# Patient Record
Sex: Female | Born: 1965
Health system: Southern US, Community
[De-identification: ages and names within clinical notes are randomized; demographics above are authoritative.]

## PROBLEM LIST (undated history)

## (undated) DIAGNOSIS — O1490 Unspecified pre-eclampsia, unspecified trimester: Secondary | ICD-10-CM

## (undated) DIAGNOSIS — G43909 Migraine, unspecified, not intractable, without status migrainosus: Secondary | ICD-10-CM

## (undated) DIAGNOSIS — R011 Cardiac murmur, unspecified: Secondary | ICD-10-CM

## (undated) DIAGNOSIS — J45909 Unspecified asthma, uncomplicated: Secondary | ICD-10-CM

## (undated) DIAGNOSIS — J189 Pneumonia, unspecified organism: Secondary | ICD-10-CM

## (undated) DIAGNOSIS — M722 Plantar fascial fibromatosis: Secondary | ICD-10-CM

## (undated) DIAGNOSIS — G510 Bell's palsy: Secondary | ICD-10-CM

## (undated) HISTORY — DX: Migraine, unspecified, not intractable, without status migrainosus: G43.909

## (undated) HISTORY — DX: Unspecified asthma, uncomplicated: J45.909

## (undated) HISTORY — DX: Unspecified pre-eclampsia, unspecified trimester: O14.90

## (undated) HISTORY — DX: Bell's palsy: G51.0

## (undated) HISTORY — DX: Cardiac murmur, unspecified: R01.1

## (undated) HISTORY — DX: Pneumonia, unspecified organism: J18.9

## (undated) HISTORY — DX: Plantar fascial fibromatosis: M72.2

---

## 1998-08-31 DIAGNOSIS — G43909 Migraine, unspecified, not intractable, without status migrainosus: Secondary | ICD-10-CM

## 1998-08-31 HISTORY — DX: Migraine, unspecified, not intractable, without status migrainosus: G43.909

## 1999-03-21 ENCOUNTER — Inpatient Hospital Stay (HOSPITAL_COMMUNITY): Admission: AD | Admit: 1999-03-21 | Discharge: 1999-03-24 | Payer: Self-pay | Admitting: Obstetrics and Gynecology

## 1999-05-01 ENCOUNTER — Other Ambulatory Visit: Admission: RE | Admit: 1999-05-01 | Discharge: 1999-05-01 | Payer: Self-pay | Admitting: Obstetrics and Gynecology

## 2000-05-07 ENCOUNTER — Other Ambulatory Visit: Admission: RE | Admit: 2000-05-07 | Discharge: 2000-05-07 | Payer: Self-pay | Admitting: Obstetrics and Gynecology

## 2001-08-31 HISTORY — PX: DILATION AND CURETTAGE OF UTERUS: SHX78

## 2001-11-25 ENCOUNTER — Ambulatory Visit (HOSPITAL_COMMUNITY): Admission: RE | Admit: 2001-11-25 | Discharge: 2001-11-25 | Payer: Self-pay | Admitting: Obstetrics and Gynecology

## 2002-09-27 ENCOUNTER — Other Ambulatory Visit: Admission: RE | Admit: 2002-09-27 | Discharge: 2002-09-27 | Payer: Self-pay | Admitting: Obstetrics and Gynecology

## 2003-02-01 ENCOUNTER — Ambulatory Visit (HOSPITAL_COMMUNITY): Admission: AD | Admit: 2003-02-01 | Discharge: 2003-02-01 | Payer: Self-pay | Admitting: Obstetrics and Gynecology

## 2003-04-09 ENCOUNTER — Inpatient Hospital Stay (HOSPITAL_COMMUNITY): Admission: AD | Admit: 2003-04-09 | Discharge: 2003-04-09 | Payer: Self-pay | Admitting: Obstetrics and Gynecology

## 2003-04-24 ENCOUNTER — Inpatient Hospital Stay (HOSPITAL_COMMUNITY): Admission: AD | Admit: 2003-04-24 | Discharge: 2003-04-27 | Payer: Self-pay | Admitting: Obstetrics & Gynecology

## 2003-04-30 ENCOUNTER — Encounter: Admission: RE | Admit: 2003-04-30 | Discharge: 2003-05-30 | Payer: Self-pay | Admitting: Obstetrics and Gynecology

## 2003-06-12 ENCOUNTER — Other Ambulatory Visit: Admission: RE | Admit: 2003-06-12 | Discharge: 2003-06-12 | Payer: Self-pay | Admitting: Obstetrics and Gynecology

## 2004-09-24 ENCOUNTER — Other Ambulatory Visit: Admission: RE | Admit: 2004-09-24 | Discharge: 2004-09-24 | Payer: Self-pay | Admitting: Obstetrics and Gynecology

## 2005-10-21 ENCOUNTER — Other Ambulatory Visit: Admission: RE | Admit: 2005-10-21 | Discharge: 2005-10-21 | Payer: Self-pay | Admitting: Obstetrics and Gynecology

## 2011-09-01 DIAGNOSIS — J189 Pneumonia, unspecified organism: Secondary | ICD-10-CM

## 2011-09-01 HISTORY — DX: Pneumonia, unspecified organism: J18.9

## 2011-10-06 ENCOUNTER — Telehealth: Payer: Self-pay | Admitting: Neurology

## 2011-10-06 NOTE — Telephone Encounter (Signed)
Dr. Laurey Morale office would like to know if we can work in this pt for bell's palsy. She has had bell's palsy for 2 weeks now.

## 2011-10-07 NOTE — Telephone Encounter (Signed)
ok 

## 2011-10-07 NOTE — Telephone Encounter (Signed)
Appt scheduled for 10/15/11 at 11:30

## 2011-10-15 ENCOUNTER — Encounter: Payer: Self-pay | Admitting: Neurology

## 2011-10-15 ENCOUNTER — Other Ambulatory Visit (INDEPENDENT_AMBULATORY_CARE_PROVIDER_SITE_OTHER): Payer: BLUE CROSS/BLUE SHIELD

## 2011-10-15 ENCOUNTER — Ambulatory Visit (INDEPENDENT_AMBULATORY_CARE_PROVIDER_SITE_OTHER): Payer: BLUE CROSS/BLUE SHIELD | Admitting: Neurology

## 2011-10-15 DIAGNOSIS — G510 Bell's palsy: Secondary | ICD-10-CM | POA: Insufficient documentation

## 2011-10-15 LAB — CBC WITH DIFFERENTIAL/PLATELET
Basophils Relative: 0.7 % (ref 0.0–3.0)
Eosinophils Absolute: 0 10*3/uL (ref 0.0–0.7)
Eosinophils Relative: 0 % (ref 0.0–5.0)
Hemoglobin: 13.2 g/dL (ref 12.0–15.0)
Lymphocytes Relative: 35.5 % (ref 12.0–46.0)
MCHC: 33.7 g/dL (ref 30.0–36.0)
MCV: 93.5 fl (ref 78.0–100.0)
Monocytes Absolute: 0.4 10*3/uL (ref 0.1–1.0)
Neutro Abs: 2.5 10*3/uL (ref 1.4–7.7)
Neutrophils Relative %: 55.6 % (ref 43.0–77.0)
RBC: 4.21 Mil/uL (ref 3.87–5.11)
WBC: 4.4 10*3/uL — ABNORMAL LOW (ref 4.5–10.5)

## 2011-10-15 LAB — SEDIMENTATION RATE: Sed Rate: 10 mm/hr (ref 0–22)

## 2011-10-15 NOTE — Patient Instructions (Signed)
Go to the basement to have your labs drawn today.  Your MRI is scheduled at the [facility] right beside [facility]. The address is [address] [place]. Your appointment is Saturday, February 16th at 10:00 am. Please arrive 15 minutes prior to your scheduled appointment.     [phone].

## 2011-10-15 NOTE — Progress Notes (Signed)
Dear Dr. Waynard Edwards,  Thank you for having me see Christina Rojas in consultation today at [facility] for her problem with an idiopathic right CN 7 palsy.  As you may recall, she is a 46 y.o. year old female with a history of migraines who presented about 5 weeks ago with quickly progressive right facial droop, right sided hyperacusis and numbness on the face.  You diagnosed her with a Bell's palsy and gave her a steroid taper and anti-viral.  She has had quick recovery of her function.  She does continue to complain of numbness on the right face.    She denies any other neighborhood signs.  No exposure to ticks.  Past Medical History  Diagnosis Date  . Migraines 2000    Past Surgical History  Procedure Date  . Dilation and curettage of uterus 2003    History   Social History  . Marital Status: Single    Spouse Name: N/A    Number of Children: N/A  . Years of Education: N/A   Social History Main Topics  . Smoking status: Never Smoker   . Smokeless tobacco: Never Used  . Alcohol Use: None     occasion/socially  . Drug Use: None  . Sexually Active: None   Other Topics Concern  . None   Social History Narrative  . None    No family history of Bell's palsy.  No current outpatient prescriptions on file prior to visit.    No Known Allergies    ROS:  13 systems were reviewed and are notable for no fevers or chills.  All other review of systems are unremarkable.   Examination:  Filed Vitals:   10/15/11 1119  BP: 136/70  Pulse: 80  Weight: 207 lb (93.895 kg)     In general, well appearing women.  Cardiovascular: The patient has a regular rate and rhythm and no carotid bruits.  Fundoscopy:  Disks are flat. Vessel caliber within normal limits.  Mental status:   The patient is oriented to person, place and time. Recent and remote memory are intact. Attention span and concentration are normal. Language including repetition, naming, following commands are  intact. Fund of knowledge of current and historical events, as well as vocabulary are normal.  Cranial Nerves: Pupils are equally round and reactive to light. Visual fields full to confrontation. Extraocular movements are intact without nystagmus. She has some sensory loss to light touch over V3. She can close her right eye.  There is mild asymmetry in the right facial fold with flattening on the right.  Her blink is slightly asymmetric. Hearing is louder on right. Tongue protrusion, uvula, palate midline.  Shoulder shrug intact  Motor:  The patient has normal bulk and tone, no pronator drift.  There are no adventitious movements.  5/5 muscle strength bilaterally.  Reflexes:   Biceps  Triceps Brachioradialis Knee Ankle  Right 2+  2+  2+   2+ 2+  Left  2+  2+  2+   2+ 2+  Toes down  Coordination:  Normal finger to nose.  No dysdiadokinesia.  Sensation is intact to temperature and vibration.  Gait and Station are normal.  Tandem gait is intact.  Romberg is negative   Impression: Likely idiopathic Bell's palsy.  However, the sensory loss makes me question the diagnosis.  While numbness is sometimes described it anatomically implicates the 5th cranial nerve.  I am going to get lyme titres as well as an MRI brain with and  without contrast with thin cuts through the brainstem.  However, I expect these will be normal.  If these tests are normal and she continues to improve I don't need to see her back.    Thank you for having [place] see Christina Rojas in consultation.  Feel free to contact me with any questions.  Lupita Raider Modesto Charon, MD [facility], [place] [address] [place], [place] [address] Phone: [phone] Fax: [phone].

## 2011-10-16 LAB — B. BURGDORFI ANTIBODIES: B burgdorferi Ab IgG+IgM: 0.24 {ISR}

## 2011-10-17 ENCOUNTER — Ambulatory Visit (HOSPITAL_COMMUNITY)
Admission: RE | Admit: 2011-10-17 | Discharge: 2011-10-17 | Disposition: A | Discharge status: Home / Self Care | Payer: BC Managed Care – PPO | Source: Ambulatory Visit | Attending: Neurology | Admitting: Neurology

## 2011-10-17 DIAGNOSIS — G43909 Migraine, unspecified, not intractable, without status migrainosus: Secondary | ICD-10-CM | POA: Insufficient documentation

## 2011-10-17 DIAGNOSIS — G510 Bell's palsy: Secondary | ICD-10-CM | POA: Insufficient documentation

## 2011-10-17 DIAGNOSIS — H93299 Other abnormal auditory perceptions, unspecified ear: Secondary | ICD-10-CM | POA: Insufficient documentation

## 2011-10-17 DIAGNOSIS — R209 Unspecified disturbances of skin sensation: Secondary | ICD-10-CM | POA: Insufficient documentation

## 2011-10-17 DIAGNOSIS — Q048 Other specified congenital malformations of brain: Secondary | ICD-10-CM | POA: Insufficient documentation

## 2011-10-17 MED ORDER — GADOBENATE DIMEGLUMINE 529 MG/ML IV SOLN
20.0000 mL | Freq: Once | INTRAVENOUS | Status: AC | PRN
  Administered 2011-10-17: 20 mL via INTRAVENOUS

## 2011-10-19 ENCOUNTER — Telehealth: Payer: Self-pay | Admitting: Neurology

## 2011-10-19 NOTE — Telephone Encounter (Signed)
Called and spoke with the patient. Information given as per Dr. Wong re: MRI. The patient will call us if needed. No additional concerns/questions at this time. 

## 2011-10-19 NOTE — Telephone Encounter (Signed)
Message copied by Benay Spice on Mon Oct 19, 2011  2:10 PM ------      Message from: Milas Gain      Created: Mon Oct 19, 2011  8:55 AM       Let Ms. Sobiech know her MRI brain looked good, except for some inflammation of the right facial nerve which is what we expect to see in Bell's palsy.

## 2011-10-19 NOTE — Telephone Encounter (Signed)
Called and spoke with the patient. Information given as per Dr. Modesto Charon re: MRI. The patient will call us if needed. No additional concerns/questions at this time.

## 2011-10-19 NOTE — Telephone Encounter (Signed)
Message copied by Benay Spice on Mon Oct 19, 2011  2:12 PM ------      Message from: Denton Meek H      Created: Mon Oct 19, 2011  8:55 AM       Please let Ms. Raudenbush know her MRI was ok.

## 2011-10-22 NOTE — Progress Notes (Signed)
Spoke with the patient. Informed lab work normal.

## 2012-02-29 HISTORY — PX: OTHER SURGICAL HISTORY: SHX169

## 2012-04-05 ENCOUNTER — Other Ambulatory Visit (HOSPITAL_COMMUNITY): Payer: Self-pay | Admitting: Internal Medicine

## 2012-04-05 DIAGNOSIS — I059 Rheumatic mitral valve disease, unspecified: Secondary | ICD-10-CM

## 2012-04-06 ENCOUNTER — Ambulatory Visit (HOSPITAL_COMMUNITY): Payer: BC Managed Care – PPO | Attending: Cardiovascular Disease

## 2012-04-06 DIAGNOSIS — I059 Rheumatic mitral valve disease, unspecified: Secondary | ICD-10-CM

## 2012-04-06 DIAGNOSIS — G510 Bell's palsy: Secondary | ICD-10-CM | POA: Insufficient documentation

## 2012-04-06 DIAGNOSIS — I10 Essential (primary) hypertension: Secondary | ICD-10-CM | POA: Insufficient documentation

## 2012-04-06 DIAGNOSIS — J45909 Unspecified asthma, uncomplicated: Secondary | ICD-10-CM | POA: Insufficient documentation

## 2012-04-06 NOTE — Progress Notes (Signed)
Echocardiogram performed.  

## 2012-04-07 ENCOUNTER — Encounter (HOSPITAL_COMMUNITY): Payer: Self-pay | Admitting: Internal Medicine

## 2012-08-31 DIAGNOSIS — G510 Bell's palsy: Secondary | ICD-10-CM

## 2012-08-31 HISTORY — PX: ROTATOR CUFF REPAIR: SHX139

## 2012-08-31 HISTORY — DX: Bell's palsy: G51.0

## 2013-12-25 ENCOUNTER — Encounter (INDEPENDENT_AMBULATORY_CARE_PROVIDER_SITE_OTHER): Payer: Self-pay

## 2013-12-25 ENCOUNTER — Ambulatory Visit (INDEPENDENT_AMBULATORY_CARE_PROVIDER_SITE_OTHER): Payer: BC Managed Care – PPO | Admitting: Neurology

## 2013-12-25 ENCOUNTER — Encounter: Payer: Self-pay | Admitting: Neurology

## 2013-12-25 VITALS — BP 128/84 | HR 70 | Temp 97.7°F | Ht 68.0 in | Wt 215.0 lb

## 2013-12-25 DIAGNOSIS — R51 Headache: Secondary | ICD-10-CM

## 2013-12-25 DIAGNOSIS — E669 Obesity, unspecified: Secondary | ICD-10-CM

## 2013-12-25 DIAGNOSIS — G4710 Hypersomnia, unspecified: Secondary | ICD-10-CM

## 2013-12-25 DIAGNOSIS — R400 Somnolence: Secondary | ICD-10-CM

## 2013-12-25 DIAGNOSIS — G43909 Migraine, unspecified, not intractable, without status migrainosus: Secondary | ICD-10-CM

## 2013-12-25 DIAGNOSIS — G4733 Obstructive sleep apnea (adult) (pediatric): Secondary | ICD-10-CM

## 2013-12-25 DIAGNOSIS — R519 Headache, unspecified: Secondary | ICD-10-CM

## 2013-12-25 NOTE — Progress Notes (Signed)
Subjective:    Patient ID: Christina Rojas is a 48 y.o. female.  HPI    Huston Foley, MD, PhD [facility] [address], [address] [address] [place], [place] [address]  Dear Dr. Waynard Edwards,   I saw your patient, Christina Rojas, upon your kind request in my neurologic clinic today for initial consultation of her sleep disorder, in particular, concern for obstructive sleep apnea. The patient is unaccompanied today. As you know, Christina Rojas is a 48 year old right-handed woman with an underlying medical history of right Bell's palsy in 1/14, migraine headaches, obesity, history of pneumonia in 2013, plantar fasciitis, heart murmur with mitral prolapse and mild to moderate pulmonary insufficiency per echocardiogram, mild asthma, who is reported to snore and she complains of excessive daytime somnolence and morning HAs. She has been gaining weight. She was on Zonegran for migraine prevention per [facility], but stopped it in 2/14.  Her typical bedtime is reported to be around 10 to 11 PM and usual wake time is around 6 AM. Sleep onset typically occurs within a few minutes. She reports feeling marginally rested upon awakening. She wakes up on an average 3 times in the middle of the night and has to go to the bathroom 1 time on a typical night. She reports occasional morning headaches.  She reports excessive daytime somnolence (EDS) and Her Epworth Sleepiness Score (ESS) is 11/24 today. She has not fallen asleep while driving. The patient has not been taking a scheduled nap, except for sometimes on the weekends. She  reports feeling refreshed after a nap.  She has been known to snore for the past few years. Snoring is reportedly moderate to loud, but unclear if associated with choking sounds and witnessed apneas. The patient admits to a rare sense of choking or strangling feeling. There is no report of nighttime reflux, with no nighttime cough experienced. The patient has not  noted any RLS symptoms and is not known to kick while asleep or before falling asleep. There is no family history of RLS or OSA.  She is a restless sleeper and in the morning, the bed is quite disheveled.   She denies cataplexy, sleep paralysis, hypnagogic or hypnopompic hallucinations, or sleep attacks. She does not report any vivid dreams, nightmares, dream enactments, or parasomnias, such as sleep talking or sleep walking. The patient has not had a sleep study or a home sleep test.  She consumes  4-5 caffeinated beverages per day, usually in the form of Diet Coke but often she uses the caffeine-free kind.   Her bedroom is usually dark and cool. There is a TV in the bedroom and usually it is not on at night; she watches TV prior to falling asleep and turns it off.   Her Past Medical History Is Significant For: Past Medical History  Diagnosis Date  . Migraines 2000    Her Past Surgical History Is Significant For: Past Surgical History  Procedure Laterality Date  . Dilation and curettage of uterus  2003  . Rotator cuff repair Right 2014    Her Family History Is Significant For: Family History  Problem Relation Age of Onset  . Dementia Mother     Alzheimers    Her Social History Is Significant For: History   Social History  . Marital Status: Single    Spouse Name: N/A    Number of Children: N/A  . Years of Education: N/A   Social History Main Topics  . Smoking status:  Never Smoker   . Smokeless tobacco: Never Used  . Alcohol Use: None     Comment: occasion/socially  . Drug Use: None  . Sexual Activity: None   Other Topics Concern  . None   Social History Narrative  . None    Her Allergies Are:  No Known Allergies:   Her Current Medications Are:  Outpatient Encounter Prescriptions as of 12/25/2013  Medication Sig  . SPRINTEC 28 0.25-35 MG-MCG tablet Take 1 tablet by mouth daily.  Marland Kitchen. zonisamide (ZONEGRAN) 100 MG capsule Take 400 mg by mouth at bedtime.   :  Review of Systems:  Out of a complete 14 point review of systems, all are reviewed and negative with the exception of these symptoms as listed below:  Review of Systems  Constitutional: Positive for fatigue.  Eyes: Negative.   Respiratory: Positive for apnea (snoring).   Cardiovascular: Negative.   Gastrointestinal: Negative.   Endocrine: Negative.   Genitourinary: Negative.   Musculoskeletal: Negative.   Skin: Negative.   Allergic/Immunologic: Negative.   Neurological: Positive for headaches.  Hematological: Negative.   Psychiatric/Behavioral: Positive for sleep disturbance (e.d.s., snoring, frequent waking).    Objective:  Neurologic Exam  Physical Exam Physical Examination:   Filed Vitals:   12/25/13 1016  BP: 128/84  Pulse: 70  Temp: 97.7 F (36.5 C)    General Examination: The patient is a very pleasant 48 y.o. female in no acute distress. She appears well-developed and well-nourished and well groomed.   HEENT: Normocephalic, atraumatic, pupils are equal, round and reactive to light and accommodation. Funduscopic exam is normal with sharp disc margins noted. Extraocular tracking is good without limitation to gaze excursion or nystagmus noted. Normal smooth pursuit is noted. Hearing is grossly intact. Tympanic membranes are clear bilaterally. Face is symmetric with normal facial animation and normal facial sensation. Speech is clear with no dysarthria noted. There is no hypophonia. There is no lip, neck/head, jaw or voice tremor. Neck is supple with full range of passive and active motion. There are no carotid bruits on auscultation. Oropharynx exam reveals: mild mouth dryness, adequate dental hygiene and moderate airway crowding, due to narrow airway entry, larger uvula and tongue on the larger side. Mallampati is class II. Tongue protrudes centrally and palate elevates symmetrically. Tonsils are 1+. Neck size is 14 7/8 inches. She has a tiny overbite. Nasal inspection  reveals no significant nasal mucosal bogginess or redness and no septal deviation.   Chest: Clear to auscultation without wheezing, rhonchi or crackles noted.  Heart: S1+S2+0, regular and normal without murmurs, rubs or gallops noted.   Abdomen: Soft, non-tender and non-distended with normal bowel sounds appreciated on auscultation.  Extremities: There is no pitting edema in the distal lower extremities bilaterally. Pedal pulses are intact.  Skin: Warm and dry without trophic changes noted. There are no varicose veins.  Musculoskeletal: exam reveals no obvious joint deformities, tenderness or joint swelling or erythema.   Neurologically:  Mental status: The patient is awake, alert and oriented in all 4 spheres. Her immediate and remote memory, attention, language skills and fund of knowledge are appropriate. There is no evidence of aphasia, agnosia, apraxia or anomia. Speech is clear with normal prosody and enunciation. Thought process is linear. Mood is normal and affect is normal.  Cranial nerves II - XII are as described above under HEENT exam. In addition: shoulder shrug is normal with equal shoulder height noted. Motor exam: Normal bulk, strength and tone is noted. There is no drift,  tremor or rebound. Romberg is negative. Reflexes are 2+ throughout. Babinski: Toes are flexor bilaterally. Fine motor skills and coordination: intact with normal finger taps, normal hand movements, normal rapid alternating patting, normal foot taps and normal foot agility.  Cerebellar testing: No dysmetria or intention tremor on finger to nose testing. Heel to shin is unremarkable bilaterally. There is no truncal or gait ataxia.  Sensory exam: intact to light touch, pinprick, vibration, temperature sense the upper and lower extremities.  Gait, station and balance: She stands easily. No veering to one side is noted. No leaning to one side is noted. Posture is age-appropriate and stance is narrow based. Gait shows  normal stride length and normal pace. No problems turning are noted. She turns en bloc. Tandem walk is unremarkable. Intact toe and heel stance is noted.               Assessment and Plan:   In summary, Christina Rojas is a very pleasant 48 y.o.-year old female with a history and physical exam concerning for obstructive sleep apnea (OSA). She reports moderate to loud snoring, has a tighter looking airway, is overweight and reports morning headaches and daytime somnolence. I had a long chat with the patient about my findings and the diagnosis of OSA, its prognosis and treatment options. We talked about medical treatments, surgical interventions and non-pharmacological approaches. I explained in particular the risks and ramifications of untreated moderate to severe OSA, especially with respect to developing cardiovascular disease down the Road, including congestive heart failure, difficult to treat hypertension, cardiac arrhythmias, or stroke. Even type 2 diabetes has, in part, been linked to untreated OSA. Symptoms of untreated OSA include daytime sleepiness, memory problems, mood irritability and mood disorder such as depression and anxiety, lack of energy, as well as recurrent headaches, especially morning headaches. We talked about trying to maintain a healthy lifestyle in general, as well as the importance of weight control. I encouraged the patient to eat healthy, exercise daily and keep well hydrated, to keep a scheduled bedtime and wake time routine, to not skip any meals and eat healthy snacks in between meals. I advised the patient not to drive when feeling sleepy. I recommended the following at this time: sleep study with potential positive airway pressure titration.  I explained the sleep test procedure to the patient and also outlined possible surgical and non-surgical treatment options of OSA, including the use of a custom-made dental device (which would require a referral to a specialist  dentist or oral surgeon), upper airway surgical options, such as pillar implants, radiofrequency surgery, tongue base surgery, and UPPP (which would involve a referral to an ENT surgeon). Rarely, jaw surgery such as mandibular advancement may be considered.  I also explained the CPAP treatment option to the patient, who indicated that she would be willing to try CPAP if the need arises. I explained the importance of being compliant with PAP treatment, not only for insurance purposes but primarily to improve Her symptoms, and for the patient's long term health benefit, including to reduce Her cardiovascular risks. I answered all her questions today and the patient was in agreement. I would like to see her back after the sleep study is completed and encouraged her to call with any interim questions, concerns, problems or updates.   Thank you very much for allowing me to participate in the care of this nice patient. If I can be of any further assistance to you please do not hesitate to call me  at [phone].  Sincerely,   Star Age, MD, PhD

## 2013-12-25 NOTE — Patient Instructions (Addendum)
Based on your symptoms and your exam I believe you are at risk for obstructive sleep apnea or OSA, and I think we should proceed with a sleep study to determine whether you do or do not have OSA and how severe it is. If you have more than mild OSA, I want you to consider treatment with CPAP. Please remember, the risks and ramifications of moderate to severe obstructive sleep apnea or OSA are: Cardiovascular disease, including congestive heart failure, stroke, difficult to control hypertension, arrhythmias, and even type 2 diabetes has been linked to untreated OSA. Sleep apnea causes disruption of sleep and sleep deprivation in most cases, which, in turn, can cause recurrent headaches, problems with memory, mood, concentration, focus, and vigilance. Most people with untreated sleep apnea report excessive daytime sleepiness, which can affect their ability to drive. Please do not drive if you feel sleepy.  I will see you back after your sleep study to go over the test results and where to go from there. We will call you after your sleep study and to set up an appointment at the time.   Try to reduce your caffeine and diet soda intake.   Please remember to try to maintain good sleep hygiene, which means: Keep a regular sleep and wake schedule, try not to exercise or have a meal within 2 hours of your bedtime, try to keep your bedroom conducive for sleep, that is, cool and dark, without light distractors such as an illuminated alarm clock, and refrain from watching TV right before sleep or in the middle of the night and do not keep the TV or radio on during the night. Also, try not to use or play on electronic devices at bedtime, such as your cell phone, tablet PC or laptop. If you like to read at bedtime on an electronic device, try to dim the background light as much as possible. Do not eat in the middle of the night.

## 2013-12-25 NOTE — Progress Notes (Signed)
Patient given AD brochure

## 2014-01-15 ENCOUNTER — Ambulatory Visit (INDEPENDENT_AMBULATORY_CARE_PROVIDER_SITE_OTHER): Payer: BC Managed Care – PPO

## 2014-01-15 ENCOUNTER — Encounter: Payer: Self-pay | Admitting: Podiatry

## 2014-01-15 ENCOUNTER — Ambulatory Visit (INDEPENDENT_AMBULATORY_CARE_PROVIDER_SITE_OTHER): Payer: BC Managed Care – PPO | Admitting: Podiatry

## 2014-01-15 VITALS — BP 133/80 | HR 73 | Resp 16 | Ht 68.0 in | Wt 210.0 lb

## 2014-01-15 DIAGNOSIS — M722 Plantar fascial fibromatosis: Secondary | ICD-10-CM

## 2014-01-15 MED ORDER — TRIAMCINOLONE ACETONIDE 10 MG/ML IJ SUSP
10.0000 mg | Freq: Once | INTRAMUSCULAR | Status: AC
  Administered 2014-01-15: 10 mg

## 2014-01-15 MED ORDER — DICLOFENAC SODIUM 75 MG PO TBEC: 75.0000 mg | DELAYED_RELEASE_TABLET | Freq: Two times a day (BID) | ORAL | Status: DC

## 2014-01-15 NOTE — Progress Notes (Signed)
Subjective:     Patient ID: Christina Eastatherine C Rojas, female   DOB: 03/31/1966, 48 y.o.   MRN: [id number]  Foot Pain   patient presents stating I have had pain in my right arch for the last approximately 10 months with thick getting worse over the last several months. I have had orthotics which do not seem to be helping anymore   Review of Systems  All other systems reviewed and are negative.      Objective:   Physical Exam  Nursing note and vitals reviewed. Constitutional: She is oriented to person, place, and time.  Cardiovascular: Intact distal pulses.   Musculoskeletal: Normal range of motion.  Neurological: She is oriented to person, place, and time.  Skin: Skin is warm.   neurovascular status intact with muscle strength adequate and range of motion of the subtalar and midtarsal joint within normal limits. Mild equinus condition was noted and perfusion of the digits was noted to be adequate. Upon gait evaluation she is limping on the right side and there is some collapse medial longitudinal arch and I found her to be exquisite discomfort in the right plantar fascia distal to the insertion to the calcaneus     Assessment:     Plantar fasciitis right mostly in the mid arch area    Plan:     H&P and x-rays reviewed. Today I injected the right fascia 3 mg Kenalog 5 mg Xylocaine Marcaine mixture and applied fascially brace in order to lift the arch up. Placed on Voltaren 75 mg twice a day reappoint in 10 days and discussed new orthotics at that time

## 2014-01-15 NOTE — Patient Instructions (Signed)

## 2014-01-15 NOTE — Progress Notes (Signed)
   Subjective:    Patient ID: Christina Rojas, female    DOB: 10/18/1965, 48 y.o.   MRN: [id number]  HPI Comments: "I have pain in the right foot"  Patient c/o sharp and aching plantar/medial heel right for about 6-8 months. She does have AM pain. Worse at end of day after on it all day. She has tried ice and stretching. She has had the same problem years past.      Review of Systems  Musculoskeletal: Positive for myalgias.  All other systems reviewed and are negative.      Objective:   Physical Exam        Assessment & Plan:

## 2014-01-24 ENCOUNTER — Ambulatory Visit (INDEPENDENT_AMBULATORY_CARE_PROVIDER_SITE_OTHER): Payer: BC Managed Care – PPO | Admitting: Podiatry

## 2014-01-24 VITALS — BP 129/71 | HR 79 | Resp 16

## 2014-01-24 DIAGNOSIS — M722 Plantar fascial fibromatosis: Secondary | ICD-10-CM

## 2014-01-24 NOTE — Progress Notes (Signed)
Subjective:     Patient ID: Christina Rojas, female   DOB: 06-Sep-1965, 48 y.o.   MRN: [id number]  HPI patient states she is doing much better with the right foot with diminishment of discomfort post heel  Review of Systems     Objective:   Physical Exam Neurovascular status intact with no other health history changes noted and structure of the right foot looks good with good range of motion    Assessment:     Doing well post plantar fasciitis right foot    Plan:     X-rays reviewed and encouraged to gradually increase activity levels. Reappoint her recheck and at this time scanned for custom orthotics to reduce stress against the foot

## 2014-01-30 ENCOUNTER — Ambulatory Visit (INDEPENDENT_AMBULATORY_CARE_PROVIDER_SITE_OTHER): Payer: BC Managed Care – PPO | Admitting: Neurology

## 2014-01-30 DIAGNOSIS — R51 Headache: Secondary | ICD-10-CM

## 2014-01-30 DIAGNOSIS — R400 Somnolence: Secondary | ICD-10-CM

## 2014-01-30 DIAGNOSIS — G479 Sleep disorder, unspecified: Secondary | ICD-10-CM

## 2014-01-30 DIAGNOSIS — E669 Obesity, unspecified: Secondary | ICD-10-CM

## 2014-01-30 DIAGNOSIS — G43909 Migraine, unspecified, not intractable, without status migrainosus: Secondary | ICD-10-CM

## 2014-01-30 DIAGNOSIS — G4733 Obstructive sleep apnea (adult) (pediatric): Secondary | ICD-10-CM

## 2014-01-30 DIAGNOSIS — R519 Headache, unspecified: Secondary | ICD-10-CM

## 2014-02-14 ENCOUNTER — Telehealth: Payer: Self-pay | Admitting: Neurology

## 2014-02-14 NOTE — Telephone Encounter (Signed)
Please call and notify the patient that the recent sleep study did not show any significant obstructive sleep apnea. Please inform patient that I would like to go over the details of the study during a follow up appointment and if not already previously scheduled, arrange a followup appointment (please utilize a followu-up slot). Also, route or fax report to PCP and referring MD, if other than PCP.  Once you have spoken to patient, you can close this encounter.   Thanks,  Saima Athar, MD, PhD [facility]  

## 2014-02-16 ENCOUNTER — Encounter: Payer: Self-pay | Admitting: *Deleted

## 2014-02-16 ENCOUNTER — Ambulatory Visit (INDEPENDENT_AMBULATORY_CARE_PROVIDER_SITE_OTHER): Payer: BC Managed Care – PPO | Admitting: *Deleted

## 2014-02-16 DIAGNOSIS — M722 Plantar fascial fibromatosis: Secondary | ICD-10-CM

## 2014-02-16 NOTE — Telephone Encounter (Signed)
I called and left a message for the patient about her recent sleep study results. I informed the patient that the study did not show any significant obstructive sleep apnea and that Dr. Frances FurbishAthar would like to discuss the study in detail during a follow up visit. I will fax a copy of the report to Dr. Laurey MoralePerini's office and mail a copy to the patient.

## 2014-02-16 NOTE — Progress Notes (Signed)
   Subjective:    Patient ID: Christina Rojas, female    DOB: 05/13/1966, 48 y.o.   MRN: [id number]  HPI  PICK UP ORTHOTICS AND GIVEN INSTRUCTION.    Review of Systems     Objective:   Physical Exam        Assessment & Plan:

## 2014-02-16 NOTE — Patient Instructions (Signed)

## 2014-02-27 ENCOUNTER — Institutional Professional Consult (permissible substitution): Payer: BC Managed Care – PPO | Admitting: Neurology

## 2014-08-07 ENCOUNTER — Encounter: Payer: Self-pay | Admitting: Neurology

## 2014-08-07 ENCOUNTER — Ambulatory Visit (INDEPENDENT_AMBULATORY_CARE_PROVIDER_SITE_OTHER): Payer: BC Managed Care – PPO | Admitting: Neurology

## 2014-08-07 VITALS — BP 134/80 | HR 79 | Temp 98.5°F | Ht 67.0 in | Wt 221.0 lb

## 2014-08-07 DIAGNOSIS — R9089 Other abnormal findings on diagnostic imaging of central nervous system: Secondary | ICD-10-CM

## 2014-08-07 DIAGNOSIS — R930 Abnormal findings on diagnostic imaging of skull and head, not elsewhere classified: Secondary | ICD-10-CM

## 2014-08-07 DIAGNOSIS — G5139 Clonic hemifacial spasm, unspecified: Secondary | ICD-10-CM

## 2014-08-07 DIAGNOSIS — G245 Blepharospasm: Secondary | ICD-10-CM

## 2014-08-07 DIAGNOSIS — G518 Other disorders of facial nerve: Secondary | ICD-10-CM

## 2014-08-07 DIAGNOSIS — G510 Bell's palsy: Secondary | ICD-10-CM

## 2014-08-07 NOTE — Progress Notes (Signed)
Subjective:    Patient ID: Christina Rojas is a 48 y.o. female.  HPI      Interim history:   Christina Rojas is a 48 year old right-handed woman with an underlying medical history of right Bell's palsy in February 2013, migraine [facility]), obesity, history of pneumonia in 2013, plantar fasciitis, heart murmur with mitral prolapse, mild to moderate pulmonary insufficiency per echocardiogram, and mild asthma, who presents for a new problem. She is unaccompanied today. Of note, she no showed for an appointment on 02/27/2014 to discuss Christina Rojas sleep study results. She is referred by Christina Rojas primary care physician for new onset facial spasms affecting primarily Christina Rojas right eyelid but sometimes also Christina Rojas midface. This started in October. She had recovered well from Christina Rojas right Bell's palsy. The right eye twitching started gradually but is now almost daily and is annoying. She does not have any pain. She has not noted any involuntary twitching elsewhere. She does not have any muscle weakness or new facial weakness. She has not noted any abnormal involuntary neck twitching or abnormal muscle contraction in Christina Rojas neck. She denies any previous problem similar to this. She had a brain MRI w/wo contrast on 10/17/11: Nonspecific white matter type changes as detailed above. Slightly asymmetric appearance of the seventh cranial nerve with greater enhancement on the right possibly related to the patient's history of Bell's palsy. Mild prominence soft tissue posterior-superior nasopharynx.  Please see above discussion. I first met Christina Rojas on 12/25/2013 at the request of Christina Rojas primary care physician, at which time she reported snoring and daytime somnolence. She also reported morning headaches and nocturia once per night on average. I advised Christina Rojas to have a sleep study. She had baseline sleep study on 01/30/2014 and went over Christina Rojas test results with Christina Rojas in detail today. Sleep efficiency was reduced at  75.7% with a latency to sleep of 28 minutes and wake after sleep onset prolonged at 84 minutes with mild to moderate sleep fragmentation noted. She had primarily spontaneous arousals with an index of 7.4 per hour. She had an increased percentage of stage II sleep, decreased percentage of deep sleep and a normal percentage of REM sleep at a normal REM latency. She had no significant periodic leg movements of sleep and no significant EKG changes. Mild intermittent snoring was noted. Total AHI was 4.5 per hour, rising to 8 per hour in REM sleep. She did not achieve any supine sleep. Baseline oxygen saturation was 93%, nadir was 88%. Time below 90% saturation was 44 seconds. I felt, the absence of supine sleep may underestimate Christina Rojas underlying sleep disordered breathing.   Christina Rojas Past Medical History Is Significant For: Past Medical History  Diagnosis Date  . Migraines 2000  . Bell's palsy 01/14  . Asthma mild  . Heart murmur     echo 11/07  . Pre-eclampsia     2nd child  . Plantar fasciitis, right     right greater than left  . Pneumonia 2013    after shoulder surgery    Christina Rojas Past Surgical History Is Significant For: Past Surgical History  Procedure Laterality Date  . Dilation and curettage of uterus  2003  . Rotator cuff repair Right 2014  . Bursitis  07/13  . Bone spur  07/13    Christina Rojas Family History Is Significant For: Family History  Problem Relation Age of Onset  . Dementia Mother     Alzheimers  . Hypertension Father   . Kidney Stones Mother   .  Kidney Stones Sister   . Osteoporosis Mother     grandparents  . Hyperlipidemia Mother     Christina Rojas Social History Is Significant For: History   Social History  . Marital Status: Married    Spouse Name: Herbie Baltimore    Number of Children: 3  . Years of Education: 18   Occupational History  .      [employer]   Social History Main Topics  . Smoking status: Never Smoker   . Smokeless tobacco: Never Used  . Alcohol Use: 0.0 oz/week    0  Not specified per week     Comment: occasion/socially  . Drug Use: No  . Sexual Activity: None   Other Topics Concern  . None   Social History Narrative   Consumes one soda daily    Christina Rojas Allergies Are:  No Known Allergies:   Christina Rojas Current Medications Are:  Outpatient Encounter Prescriptions as of 08/07/2014  Medication Sig  . SPRINTEC 28 0.25-35 MG-MCG tablet Take 1 tablet by mouth daily.  . [DISCONTINUED] diclofenac (VOLTAREN) 75 MG EC tablet Take 1 tablet (75 mg total) by mouth 2 (two) times daily. (Patient not taking: Reported on 08/07/2014)  :  Review of Systems:  Out of a complete 14 point review of systems, all are reviewed and negative with the exception of these symptoms as listed below:   Review of Systems  Eyes:       Blurred vision  Neurological: Positive for headaches.    Objective:  Neurologic Exam  Physical Exam Physical Examination:   Filed Vitals:   08/07/14 0938  BP: 134/80  Pulse: 79  Temp: 98.5 F (36.9 C)    General Examination: The patient is a very pleasant 48 y.o. female in no acute distress. She appears well-developed and well-nourished and well groomed.   HEENT: Normocephalic, atraumatic, pupils are equal, round and reactive to light and accommodation. Funduscopic exam is normal with sharp disc margins noted. Extraocular tracking is good without limitation to gaze excursion or nystagmus noted. Normal smooth pursuit is noted. Hearing is grossly intact. Tympanic membranes are clear bilaterally. Face is symmetric with normal facial animation and normal facial sensation, with the exception of intermittent but continual twitching of the right upper eyelid and particularly below the right lower eyelid. I do not see any significant twitching in Christina Rojas face or mouth area. She has no evidence of cervical dystonia. Speech is clear with no dysarthria noted. There is no hypophonia. There is no lip, neck/head, jaw or voice tremor. Neck is supple with full range of  passive and active motion. There are no carotid bruits on auscultation. Oropharynx exam reveals: mild mouth dryness, adequate dental hygiene and moderate airway crowding, due to narrow airway entry, larger uvula and tongue on the larger side. Mallampati is class II. Tongue protrudes centrally and palate elevates symmetrically.   Chest: Clear to auscultation without wheezing, rhonchi or crackles noted.  Heart: S1+S2+0, regular and normal without murmurs, rubs or gallops noted.   Abdomen: Soft, non-tender and non-distended with normal bowel sounds appreciated on auscultation.  Extremities: There is no pitting edema in the distal lower extremities bilaterally. Pedal pulses are intact.  Skin: Warm and dry without trophic changes noted. There are no varicose veins.  Musculoskeletal: exam reveals no obvious joint deformities, tenderness or joint swelling or erythema.   Neurologically:  Mental status: The patient is awake, alert and oriented in all 4 spheres. Christina Rojas immediate and remote memory, attention, language skills and fund of  knowledge are appropriate. There is no evidence of aphasia, agnosia, apraxia or anomia. Speech is clear with normal prosody and enunciation. Thought process is linear. Mood is normal and affect is normal.  Cranial nerves II - XII are as described above under HEENT exam. In addition: shoulder shrug is normal with equal shoulder height noted. Motor exam: Normal bulk, strength and tone is noted. There is no drift, tremor or rebound. Romberg is negative. Reflexes are 2+ throughout. Babinski: Toes are flexor bilaterally. Fine motor skills and coordination: intact with normal finger taps, normal hand movements, normal rapid alternating patting, normal foot taps and normal foot agility.  Cerebellar testing: No dysmetria or intention tremor on finger to nose testing. Heel to shin is unremarkable bilaterally. There is no truncal or gait ataxia.  Sensory exam: intact to light touch,  pinprick, vibration, temperature sense the upper and lower extremities.  Gait, station and balance: She stands easily. No veering to one side is noted. No leaning to one side is noted. Posture is age-appropriate and stance is narrow based. Gait shows normal stride length and normal pace. No problems turning are noted. She turns en bloc. Tandem walk is unremarkable.   Assessment and Plan:   In summary, JABRIA LOOS is a very pleasant 48 y.o.-year old female with a history of right Bell's palsy in February 2013 who has developed right hemifacial spasm. This started in October and is primarily affecting Christina Rojas right eye in keeping with blepharospasm. She has intermittent facial twitching in Christina Rojas right midface, which is currently not detected. I had a long discussion with the patient regarding this condition and treatment options. Trial of systemic medication such as muscle relaxers or benzodiazepines are more likely to cause Christina Rojas side effects and at times Christina Rojas addicting. She is a good candidate for botulinum toxin injections. I talked to Christina Rojas at length about this procedure, its expectations, side effects and limitations. Informed consent is obtained today and we will reiterate side effects, and expectations at the time of Christina Rojas procedure. We will request insurance authorization for right hemifacial spasm as well as right blepharospasm. She is in agreement. She had a mild abnormality on Christina Rojas brain scan at the time of Christina Rojas right facial palsy in 2013. I would like to repeat Christina Rojas brain MRI with and without contrast. I will see Christina Rojas back soon for Christina Rojas first round of injections. We also talked about Christina Rojas sleep study results in detail today. I answered all Christina Rojas questions today and the patient was in agreement with the plan.

## 2014-08-07 NOTE — Patient Instructions (Signed)
We will request authorization for botox injection for a condition called hemifacial spasm.   We will schedule you soon for your first injection treatment.

## 2014-08-09 ENCOUNTER — Ambulatory Visit (INDEPENDENT_AMBULATORY_CARE_PROVIDER_SITE_OTHER): Payer: BC Managed Care – PPO

## 2014-08-09 DIAGNOSIS — R9089 Other abnormal findings on diagnostic imaging of central nervous system: Secondary | ICD-10-CM

## 2014-08-09 DIAGNOSIS — G245 Blepharospasm: Secondary | ICD-10-CM

## 2014-08-09 DIAGNOSIS — G5139 Clonic hemifacial spasm, unspecified: Secondary | ICD-10-CM

## 2014-08-09 DIAGNOSIS — R930 Abnormal findings on diagnostic imaging of skull and head, not elsewhere classified: Secondary | ICD-10-CM

## 2014-08-09 DIAGNOSIS — G518 Other disorders of facial nerve: Secondary | ICD-10-CM

## 2014-08-09 DIAGNOSIS — G510 Bell's palsy: Secondary | ICD-10-CM

## 2014-08-09 MED ORDER — GADOPENTETATE DIMEGLUMINE 469.01 MG/ML IV SOLN: 20.0000 mL | Freq: Once | INTRAVENOUS | Status: AC | PRN

## 2014-08-13 NOTE — Progress Notes (Signed)
Quick Note:  Please call patient about her brain scan: MRI brain with and without contrast shows no acute changes and no abnormal contrast uptake. In fact, report suggests no changes from a previous MRI in Feb. 2013. This is reassuring. Huston FoleySaima Akeem Heppler, MD, PhD [facility] ([facility]  ______

## 2014-08-14 ENCOUNTER — Telehealth: Payer: Self-pay | Admitting: Neurology

## 2014-08-14 NOTE — Telephone Encounter (Signed)
Called patient and moved appt. Time to 9:00,confirmed with patient

## 2014-08-17 ENCOUNTER — Ambulatory Visit (INDEPENDENT_AMBULATORY_CARE_PROVIDER_SITE_OTHER): Payer: BC Managed Care – PPO | Admitting: Neurology

## 2014-08-17 ENCOUNTER — Encounter: Payer: Self-pay | Admitting: Neurology

## 2014-08-17 VITALS — BP 125/80 | HR 75 | Temp 98.5°F | Ht 68.0 in | Wt 225.0 lb

## 2014-08-17 DIAGNOSIS — G518 Other disorders of facial nerve: Secondary | ICD-10-CM

## 2014-08-17 DIAGNOSIS — G245 Blepharospasm: Secondary | ICD-10-CM

## 2014-08-17 DIAGNOSIS — G5139 Clonic hemifacial spasm, unspecified: Secondary | ICD-10-CM

## 2014-08-17 MED ORDER — ONABOTULINUMTOXINA 100 UNITS IJ SOLR
100.0000 [IU] | Freq: Once | INTRAMUSCULAR | Status: AC
  Administered 2014-08-17: 100 [IU] via INTRAMUSCULAR

## 2014-08-17 NOTE — Patient Instructions (Signed)
As discussed, botulinum toxin takes about 3-7 days to kick in. Please remember, this is not a pain shot, this is to gradually improve your symptoms. In some patients it takes up to 2-3 weeks to make a difference and it wears off with time. Sometimes it may wear off before it is time for the next injection. We still should wait till the next 3 monthly injection, because injecting too frequently may cause you to develop immunity to the botulinum toxin. We are looking for a reduction in your headache frequency and headache severity. Side effects to look out for are mouth dryness, dryness of the eyes, heaviness of your head or muscle weakness, rarely, speech or swallowing difficulties and very rarely breathing difficulties. Some people have transient neck pain or soreness which typically responds to over-the-counter anti-inflammatory medication and local heat application with a heat pad. If you think you have a severe reaction to the botulinum toxin, such as weakness, trouble speaking, trouble breathing, or trouble swallowing, you have to call 911 or have someone take you to the nearest emergency room. However, most people have no side effects from the injections. It is normal to have a little bit of redness and swelling around the injection sites which usually improves after a few hours. Rarely, there may be a bruise that improves on its own. Most side effects reported are very mild and resolve within 10-14 days. Please feel free to call us if you have any additional questions or concerns: [phone]. Please give us a call in about a month to report how you are doing after the botulinum toxin injections or to just give us an update. Call sooner, if you have any concerns, questions or feel that you have potential side effects. We may have to adjust the dose over time, depending on your results from this injection and your overall response over time to this medication.   

## 2014-08-17 NOTE — Progress Notes (Signed)
Christina Rojas is a very pleasant 48 year old right-handed woman with an underlying medical history of right Bell's palsy in February 2013, [facility]), obesity, history of pneumonia in 2013, plantar fasciitis, heart murmur with mitral prolapse, mild to moderate pulmonary insufficiency per echocardiogram, and mild asthma, who presents for initial botulinum toxin injection for recent diagnosis of right hemifacial spasms. She is unaccompanied today. I last saw her on 08/07/2014, at which time she presented with a new problem. She was referred by her primary care physician for new onset right facial spasms, affecting primarily the area around her right eyelid but sometimes also her right midface. This started in October 2015. She had overall recovered well from her right Bell's palsy in 2013. She has no history of neuromuscular disorder. She did not have any previous similar problem. She had no new facial weakness. She had no abnormal involuntary movements otherwise or abnormal neck position or contraction such as with cervical dystonia. I talked to her about proceeding with botulinum toxin injections for right hemifacial spasm.   She had a brain MRI w/wo contrast on 10/17/11: Nonspecific white matter type changes as detailed above. Slightly asymmetric appearance of the seventh cranial nerve with greater enhancement on the right possibly related to the patient's history of Bell's palsy. Mild prominence soft tissue posterior-superior nasopharynx.  Please see above discussion.  I first met her on 12/25/2013 at the request of her primary care physician, at which time she reported snoring and daytime somnolence. She also reported morning headaches and nocturia once per night on average. I advised her to have a sleep study. She had baseline sleep study on 01/30/2014 and went over her test results with her in detail today. Sleep efficiency was reduced at 75.7% with a latency  to sleep of 28 minutes and wake after sleep onset prolonged at 84 minutes with mild to moderate sleep fragmentation noted. She had primarily spontaneous arousals with an index of 7.4 per hour. She had an increased percentage of stage II sleep, decreased percentage of deep sleep and a normal percentage of REM sleep at a normal REM latency. She had no significant periodic leg movements of sleep and no significant EKG changes. Mild intermittent snoring was noted. Total AHI was 4.5 per hour, rising to 8 per hour in REM sleep. She did not achieve any supine sleep. Baseline oxygen saturation was 93%, nadir was 88%. Time below 90% saturation was 44 seconds. I felt, the absence of supine sleep may underestimate her underlying sleep disordered breathing.  O/E: BP 125/80 mmHg  Pulse 75  Temp(Src) 98.5 F (36.9 C) (Oral)  Ht _0  (1.727 m)  Wt 225 lb (102.059 kg)  BMI 34.22 kg/m2   She has twitching around her right eyelid and in the right chin area.   Written informed consent for recurrent, 3 monthly intramuscular injections with botulinum toxin for this indication has been obtained and will be scanned into the patient's electronic chart. I will re-consent if the type of botulinum toxin used or the indication for injection changes for this patient in the future. The patient is informed that we will use the same consent for Her recurrent, most likely 3 monthly injections. She demonstrated understanding and voiced agreement. I talked to the patient in detail about expectations, limitations, benefits as well as potential adverse effects of botulinum toxin injections. The patient understands that the side effects include (but are not limited to): Mouth dryness, dryness of eyes, speech and swallowing  difficulties, respiratory depression or problems breathing, weakness of muscles including more distant muscles than the ones injected, flu-like symptoms, myalgias, injection site reactions such as redness, itching,  swelling, pain, and infection.  100 units of botulinum toxin type A were reconstituted using preservative-free normal saline to a concentration of 10 units per 0.1 mL and drawn up into 1 mL tuberculin syringes. Lot number [id number], expiration date August 2018.  The patient was situated in a chair, sitting comfortably. After preparing the areas with 70% isopropyl alcohol and using a 30 gauge 1 inch needle, a total dose of 8 units of botulinum toxin type A in the form of Xeomin was injected into the muscles and the following distribution and quantities:  #1: 2 units in the right orbicularis oculi, upper outer eyelid.   #2: 3 units in the right orbicularis oculi, lower outer eyelid. #3: 3 units in the right mentalis.    EMG guidance was not utilized.  A dose of 92 units out of a total dose of 100 units was discarded as unavoidable waste.   The patient tolerated the procedure well without immediate complications. She was advised to make a followup appointment for repeat injections in 3 months from now and encouraged to call us with any interim questions, concerns, problems, or updates. She was in agreement and did not have any questions prior to leaving clinic today.

## 2014-11-19 ENCOUNTER — Ambulatory Visit: Payer: Self-pay | Admitting: Neurology

## 2014-11-20 ENCOUNTER — Encounter: Payer: Self-pay | Admitting: Neurology

## 2014-12-05 ENCOUNTER — Encounter: Payer: Self-pay | Admitting: Neurology

## 2014-12-05 ENCOUNTER — Ambulatory Visit (INDEPENDENT_AMBULATORY_CARE_PROVIDER_SITE_OTHER): Payer: BLUE CROSS/BLUE SHIELD | Admitting: Neurology

## 2014-12-05 VITALS — BP 140/93 | HR 65 | Resp 16 | Ht 67.0 in | Wt 222.0 lb

## 2014-12-05 DIAGNOSIS — G245 Blepharospasm: Secondary | ICD-10-CM | POA: Diagnosis not present

## 2014-12-05 DIAGNOSIS — G518 Other disorders of facial nerve: Secondary | ICD-10-CM

## 2014-12-05 DIAGNOSIS — G5139 Clonic hemifacial spasm, unspecified: Secondary | ICD-10-CM

## 2014-12-05 DIAGNOSIS — G510 Bell's palsy: Secondary | ICD-10-CM

## 2014-12-05 MED ORDER — ONABOTULINUMTOXINA 100 UNITS IJ SOLR
100.0000 [IU] | Freq: Once | INTRAMUSCULAR | Status: AC
  Administered 2014-12-05: 100 [IU] via INTRAMUSCULAR

## 2014-12-05 NOTE — Patient Instructions (Signed)
As discussed, botulinum toxin takes about 3-7 days to kick in. Please remember, this is not a pain shot, this is to gradually improve your symptoms. In some patients it takes up to 2-3 weeks to make a difference and it wears off with time. Sometimes it may wear off before it is time for the next injection. We still should wait till the next 3 monthly injection, because injecting too frequently may cause you to develop immunity to the botulinum toxin. We are looking for a reduction in your headache frequency and headache severity. Side effects to look out for are mouth dryness, dryness of the eyes, heaviness of your head or muscle weakness, rarely, speech or swallowing difficulties and very rarely breathing difficulties. Some people have transient neck pain or soreness which typically responds to over-the-counter anti-inflammatory medication and local heat application with a heat pad. If you think you have a severe reaction to the botulinum toxin, such as weakness, trouble speaking, trouble breathing, or trouble swallowing, you have to call 911 or have someone take you to the nearest emergency room. However, most people have no side effects from the injections. It is normal to have a little bit of redness and swelling around the injection sites which usually improves after a few hours. Rarely, there may be a bruise that improves on its own. Most side effects reported are very mild and resolve within 10-14 days. Please feel free to call us if you have any additional questions or concerns: [phone]. Please give us a call in about a month to report how you are doing after the botulinum toxin injections or to just give us an update. Call sooner, if you have any concerns, questions or feel that you have potential side effects. We may have to adjust the dose over time, depending on your results from this injection and your overall response over time to this medication.   

## 2014-12-05 NOTE — Progress Notes (Signed)
Ms. Charette is a very pleasant 49 year old right-handed woman with an underlying medical history of right Bell's palsy in February 2013, [facility]), obesity, history of pneumonia in 2013, plantar fasciitis, heart murmur with mitral prolapse, mild to moderate pulmonary insufficiency per echocardiogram, and mild asthma, who presents for repeat botulinum toxin injections for her diagnosis of right hemifacial spasms. The patient is unaccompanied today. She received her first injection on 08/17/2014, at which time she received a total dose of 8 units of botulinum toxin type A in the form of Botox.   Today, 12/05/14: She reports that the first round of Botox injection was fine. She noticed improvement in her twitching and it has been almost 3-1/2 months since her first injection and she has now noticed recurrence of twitching around her eye but not so much in her lower face her chin area. She reports no side effects and is overall quite pleased with how she has done. We mutually agreed not to increase her dose and keep the same injection sites and dose. We have room to go up if need be in the future.  Today, 12/05/2014: O/E: BP 140/93 mmHg  Pulse 65  Resp 16  Ht 5' 7"  (1.702 m)  Wt 222 lb (100.699 kg)  BMI 34.76 kg/m2: She has mild intermittent twitching around her right eyelid but not that much in right chin area. She does look better.   Written informed consent for recurrent, 3 monthly intramuscular injections with botulinum toxin for this indication has been obtained and will be scanned into the patient's electronic chart. I will re-consent if the type of botulinum toxin used or the indication for injection changes for this patient in the future. The patient is informed that we will use the same consent for Her recurrent, most likely 3 monthly injections. She demonstrated understanding and voiced agreement. I talked to the patient in detail about expectations,  limitations, benefits as well as potential adverse effects of botulinum toxin injections. The patient understands that the side effects include (but are not limited to): Mouth dryness, dryness of eyes, speech and swallowing difficulties, respiratory depression or problems breathing, weakness of muscles including more distant muscles than the ones injected, flu-like symptoms, myalgias, injection site reactions such as redness, itching, swelling, pain, and infection.  100 units of botulinum toxin type A were reconstituted using preservative-free normal saline to a concentration of 10 units per 0.1 mL and drawn up into 1 mL tuberculin syringes. NDC 4920-100 5-0 1, lot number [id number], expiration date November 2018.   The patient was situated in a chair, sitting comfortably. After preparing the areas with 70% isopropyl alcohol and using a 30 gauge 1 inch needle, a total dose of 8 units of botulinum toxin type A in the form of Botox was injected into the muscles and the following distribution and quantities:  #1: 2 units in the right orbicularis oculi, upper outer eyelid.    #2: 3 units in the right orbicularis oculi, lower outer eyelid. #3: 3 units in the right mentalis.    EMG guidance was not utilized for this procedure.   A dose of 92 units out of a total dose of 100 units was discarded as unavoidable waste.    The patient tolerated the procedure well without immediate complications. She was advised to make a followup appointment for repeat injections in 3 months from now and encouraged to call us with any interim questions, concerns, problems, or updates. She was in  agreement and did not have any questions prior to leaving clinic today.  Previously:  I saw her on 08/07/2014, at which time she presented with a new problem. She was referred by her primary care physician for new onset right facial spasms, affecting primarily the area around her right eyelid but sometimes also her right midface. This  started in October 2015. She had overall recovered well from her right Bell's palsy in 2013. She has no history of neuromuscular disorder. She did not have any previous similar problem. She had no new facial weakness. She had no abnormal involuntary movements otherwise or abnormal neck position or contraction such as with cervical dystonia. I talked to her about proceeding with botulinum toxin injections for right hemifacial spasm.   She had a brain MRI w/wo contrast on 10/17/11: Nonspecific white matter type changes as detailed above. Slightly asymmetric appearance of the seventh cranial nerve with greater enhancement on the right possibly related to the patient's history of Bell's palsy. Mild prominence soft tissue posterior-superior nasopharynx.  Please see above discussion.  I first met her on 12/25/2013 at the request of her primary care physician, at which time she reported snoring and daytime somnolence. She also reported morning headaches and nocturia once per night on average. I advised her to have a sleep study. She had baseline sleep study on 01/30/2014 and went over her test results with her in detail today. Sleep efficiency was reduced at 75.7% with a latency to sleep of 28 minutes and wake after sleep onset prolonged at 84 minutes with mild to moderate sleep fragmentation noted. She had primarily spontaneous arousals with an index of 7.4 per hour. She had an increased percentage of stage II sleep, decreased percentage of deep sleep and a normal percentage of REM sleep at a normal REM latency. She had no significant periodic leg movements of sleep and no significant EKG changes. Mild intermittent snoring was noted. Total AHI was 4.5 per hour, rising to 8 per hour in REM sleep. She did not achieve any supine sleep. Baseline oxygen saturation was 93%, nadir was 88%. Time below 90% saturation was 44 seconds. I felt, the absence of supine sleep may underestimate her underlying sleep disordered  breathing.

## 2014-12-28 ENCOUNTER — Ambulatory Visit: Payer: Self-pay | Admitting: Neurology

## 2015-02-13 ENCOUNTER — Telehealth: Payer: Self-pay

## 2015-02-13 NOTE — Telephone Encounter (Signed)
Called patient to schedule appointment for Botox injections. No answer. Will try later.

## 2015-03-19 ENCOUNTER — Telehealth: Payer: Self-pay

## 2015-03-19 NOTE — Telephone Encounter (Addendum)
Spoke to patient. Explained Botox office policy. Patient verbalized understanding. She will contact [employer] @ [phone]. Patient will call me back after she reaches [facility].

## 2015-03-21 ENCOUNTER — Encounter: Payer: Self-pay | Admitting: Neurology

## 2015-03-21 ENCOUNTER — Ambulatory Visit (INDEPENDENT_AMBULATORY_CARE_PROVIDER_SITE_OTHER): Payer: BLUE CROSS/BLUE SHIELD | Admitting: Neurology

## 2015-03-21 VITALS — BP 129/90 | HR 72 | Resp 16 | Ht 67.0 in | Wt 220.0 lb

## 2015-03-21 DIAGNOSIS — G510 Bell's palsy: Secondary | ICD-10-CM

## 2015-03-21 DIAGNOSIS — G518 Other disorders of facial nerve: Secondary | ICD-10-CM

## 2015-03-21 DIAGNOSIS — G5139 Clonic hemifacial spasm, unspecified: Secondary | ICD-10-CM

## 2015-03-21 DIAGNOSIS — G245 Blepharospasm: Secondary | ICD-10-CM | POA: Diagnosis not present

## 2015-03-21 MED ORDER — ONABOTULINUMTOXINA 100 UNITS IJ SOLR
100.0000 [IU] | Freq: Once | INTRAMUSCULAR | Status: AC
  Administered 2015-03-21: 100 [IU] via INTRAMUSCULAR

## 2015-03-21 NOTE — Patient Instructions (Signed)
As discussed, botulinum toxin takes about 3-7 days to kick in. Please remember, this is not a pain shot, this is to gradually improve your symptoms. In some patients it takes up to 2-3 weeks to make a difference and it wears off with time. Sometimes it may wear off before it is time for the next injection. We still should wait till the next 3 monthly injection, because injecting too frequently may cause you to develop immunity to the botulinum toxin. We are looking for a reduction in your headache frequency and headache severity. Side effects to look out for are mouth dryness, dryness of the eyes, heaviness of your head or muscle weakness, rarely, speech or swallowing difficulties and very rarely breathing difficulties. Some people have transient neck pain or soreness which typically responds to over-the-counter anti-inflammatory medication and local heat application with a heat pad. If you think you have a severe reaction to the botulinum toxin, such as weakness, trouble speaking, trouble breathing, or trouble swallowing, you have to call 911 or have someone take you to the nearest emergency room. However, most people have no side effects from the injections. It is normal to have a little bit of redness and swelling around the injection sites which usually improves after a few hours. Rarely, there may be a bruise that improves on its own. Most side effects reported are very mild and resolve within 10-14 days. Please feel free to call us if you have any additional questions or concerns: [phone]. Please give us a call in about a month to report how you are doing after the botulinum toxin injections or to just give us an update. Call sooner, if you have any concerns, questions or feel that you have potential side effects. We may have to adjust the dose over time, depending on your results from this injection and your overall response over time to this medication.   

## 2015-03-21 NOTE — Progress Notes (Signed)
Christina Rojas is a very pleasant 49 year old right-handed woman with an underlying medical history of right Bell's palsy in February 2013, migraine headaches ([facility]), obesity, history of pneumonia in 2013, plantar fasciitis, heart murmur with mitral prolapse, mild to moderate pulmonary insufficiency per echocardiogram, and mild asthma, who presents for repeat botulinum toxin injections for her diagnosis of right hemifacial spasms. The patient is unaccompanied today. She received her second botulinum toxin injection on 12/05/2014, at which time she reported improvement in her facial twitching and good tolerance of the medication. We kept her dose at 8 units at the time. She had no side effects. Her first injection was on 08/17/2014, at which time she received a total dose of 8 units of botulinum toxin type A in the form of Botox.    Today, 03/21/2015: She reports that the last round of Botox injection was fine. She felt it may have kicked in a little slower this time around. She had had no side effects and her twitching around her chin area is almost completely resolved. She does note some recurrence of twitching around her right eye. We mutually agreed not to increase her dose and keep the same injection sites and dose. We have room to go up if need be in the future.  Today, 03/21/2015: O/E: BP 129/90 mmHg  Pulse 72  Resp 16  Ht _0  (1.702 m)  Wt 220 lb (99.791 kg)  BMI 34.45 kg/m2 She has mild intermittent twitching around her right eyelid but not that much in right chin area. She does look better overall from when I first saw her for this.   Written informed consent for recurrent, 3 monthly intramuscular injections with botulinum toxin for this indication has been obtained and will be scanned into the patient's electronic chart. I will re-consent if the type of botulinum toxin used or the indication for injection changes for this patient in the future. The patient is  informed that we will use the same consent for Her recurrent, most likely 3 monthly injections. She demonstrated understanding and voiced agreement. I talked to the patient in detail about expectations, limitations, benefits as well as potential adverse effects of botulinum toxin injections. The patient understands that the side effects include (but are not limited to): Mouth dryness, dryness of eyes, speech and swallowing difficulties, respiratory depression or problems breathing, weakness of muscles including more distant muscles than the ones injected, flu-like symptoms, myalgias, injection site reactions such as redness, itching, swelling, pain, and infection.  100 units of botulinum toxin type A were reconstituted using preservative-free normal saline to a concentration of 10 units per 0.1 mL and drawn up into 1 mL tuberculin syringes. NDC 3244-010 5-0 1, lot number [id number], expiration date February 2019  The patient was situated in a chair, sitting comfortably. After preparing the areas with 70% isopropyl alcohol and using a 30 gauge 1 inch needle, a total dose of 8 units of botulinum toxin type A in the form of Botox was injected into the muscles and the following distribution and quantities:  #1: 2 units in the right orbicularis oculi, upper outer eyelid.    #2: 3 units in the right orbicularis oculi, lower outer eyelid. #3: 3 units in the right mentalis.    EMG guidance was not utilized for this procedure.   A dose of 92 units out of a total dose of 100 units was discarded as unavoidable waste.    The patient tolerated the procedure  well without immediate complications. She was advised to make a followup appointment for repeat injections in 3 months from now and encouraged to call us with any interim questions, concerns, problems, or updates. She was in agreement and did not have any questions prior to leaving clinic today.  Previously:  I saw her on 08/07/2014, at which time she presented  with a new problem. She was referred by her primary care physician for new onset right facial spasms, affecting primarily the area around her right eyelid but sometimes also her right midface. This started in October 2015. She had overall recovered well from her right Bell's palsy in 2013. She has no history of neuromuscular disorder. She did not have any previous similar problem. She had no new facial weakness. She had no abnormal involuntary movements otherwise or abnormal neck position or contraction such as with cervical dystonia. I talked to her about proceeding with botulinum toxin injections for right hemifacial spasm.   She had a brain MRI w/wo contrast on 10/17/11: Nonspecific white matter type changes as detailed above. Slightly asymmetric appearance of the seventh cranial nerve with greater enhancement on the right possibly related to the patient's history of Bell's palsy. Mild prominence soft tissue posterior-superior nasopharynx.  Please see above discussion.  I first met her on 12/25/2013 at the request of her primary care physician, at which time she reported snoring and daytime somnolence. She also reported morning headaches and nocturia once per night on average. I advised her to have a sleep study. She had baseline sleep study on 01/30/2014 and went over her test results with her in detail today. Sleep efficiency was reduced at 75.7% with a latency to sleep of 28 minutes and wake after sleep onset prolonged at 84 minutes with mild to moderate sleep fragmentation noted. She had primarily spontaneous arousals with an index of 7.4 per hour. She had an increased percentage of stage II sleep, decreased percentage of deep sleep and a normal percentage of REM sleep at a normal REM latency. She had no significant periodic leg movements of sleep and no significant EKG changes. Mild intermittent snoring was noted. Total AHI was 4.5 per hour, rising to 8 per hour in REM sleep. She did not achieve any  supine sleep. Baseline oxygen saturation was 93%, nadir was 88%. Time below 90% saturation was 44 seconds. I felt, the absence of supine sleep may underestimate her underlying sleep disordered breathing.

## 2015-03-26 ENCOUNTER — Other Ambulatory Visit: Payer: Self-pay | Admitting: Obstetrics and Gynecology

## 2015-03-27 LAB — CYTOLOGY - PAP

## 2015-03-28 ENCOUNTER — Other Ambulatory Visit: Payer: Self-pay | Admitting: Obstetrics and Gynecology

## 2015-03-28 DIAGNOSIS — R928 Other abnormal and inconclusive findings on diagnostic imaging of breast: Secondary | ICD-10-CM

## 2015-04-03 ENCOUNTER — Ambulatory Visit
Admission: RE | Admit: 2015-04-03 | Discharge: 2015-04-03 | Disposition: A | Discharge status: Home / Self Care | Payer: BLUE CROSS/BLUE SHIELD | Source: Ambulatory Visit | Attending: Obstetrics and Gynecology | Admitting: Obstetrics and Gynecology

## 2015-04-03 DIAGNOSIS — R928 Other abnormal and inconclusive findings on diagnostic imaging of breast: Secondary | ICD-10-CM

## 2015-06-20 ENCOUNTER — Ambulatory Visit (INDEPENDENT_AMBULATORY_CARE_PROVIDER_SITE_OTHER): Payer: BLUE CROSS/BLUE SHIELD | Admitting: Neurology

## 2015-06-20 ENCOUNTER — Encounter: Payer: Self-pay | Admitting: Neurology

## 2015-06-20 VITALS — BP 132/98 | HR 78 | Resp 16 | Ht 68.0 in | Wt 219.0 lb

## 2015-06-20 DIAGNOSIS — G518 Other disorders of facial nerve: Secondary | ICD-10-CM | POA: Diagnosis not present

## 2015-06-20 DIAGNOSIS — G245 Blepharospasm: Secondary | ICD-10-CM

## 2015-06-20 DIAGNOSIS — G5139 Clonic hemifacial spasm, unspecified: Secondary | ICD-10-CM

## 2015-06-20 MED ORDER — ONABOTULINUMTOXINA 100 UNITS IJ SOLR
100.0000 [IU] | Freq: Once | INTRAMUSCULAR | Status: AC
  Administered 2015-06-20: 100 [IU] via INTRAMUSCULAR

## 2015-06-20 NOTE — Patient Instructions (Signed)
As discussed, botulinum toxin takes about 3-7 days to kick in. Please remember, this is not a pain shot, this is to gradually improve your symptoms. In some patients it takes up to 2-3 weeks to make a difference and it wears off with time. Sometimes it may wear off before it is time for the next injection. We still should wait till the next 3 monthly injection, because injecting too frequently may cause you to develop immunity to the botulinum toxin. We are looking for a reduction in your headache frequency and headache severity. Side effects to look out for are mouth dryness, dryness of the eyes, heaviness of your head or muscle weakness, rarely, speech or swallowing difficulties and very rarely breathing difficulties. Some people have transient neck pain or soreness which typically responds to over-the-counter anti-inflammatory medication and local heat application with a heat pad. If you think you have a severe reaction to the botulinum toxin, such as weakness, trouble speaking, trouble breathing, or trouble swallowing, you have to call 911 or have someone take you to the nearest emergency room. However, most people have no side effects from the injections. It is normal to have a little bit of redness and swelling around the injection sites which usually improves after a few hours. Rarely, there may be a bruise that improves on its own. Most side effects reported are very mild and resolve within 10-14 days. Please feel free to call us if you have any additional questions or concerns: [phone]. Please give us a call in about a month to report how you are doing after the botulinum toxin injections or to just give us an update. Call sooner, if you have any concerns, questions or feel that you have potential side effects. We may have to adjust the dose over time, depending on your results from this injection and your overall response over time to this medication.   

## 2015-06-20 NOTE — Progress Notes (Signed)
Christina Rojas is a very pleasant 49 year old right-handed woman with an underlying medical history of right Bell's palsy in February 2013, [facility] ([facility]), obesity, history of pneumonia in 2013, plantar fasciitis, heart murmur with mitral prolapse, mild to moderate pulmonary insufficiency per echocardiogram, and mild asthma, who presents for repeat botulinum toxin injections for her diagnosis of right hemifacial spasms. The patient is unaccompanied today. She received her third injection on 03/21/2015 and did well. We kept her dose the same.  Today, 06/20/2015: She reports good results with her last injection. She had no side effects. We will continue with the current dose.  O/E: .BP 132/98 mmHg  Pulse 78  Resp 16  Ht 5' 8"  (1.727 m)  Wt 219 lb (99.338 kg)  BMI 33.31 kg/m2 She has mild intermittent twitching around her right eyelid but not that much in right chin area. She does look better overall from when I first saw her for this. She reports intermittent twitching around the right nasolabial fold which is not apparent currently. We can consider additional injection in that area in the future. She is agreeable to this.  Written informed consent for recurrent, 3 monthly intramuscular injections with botulinum toxin for this indication has been obtained and will be scanned into the patient's electronic chart. I will re-consent if the type of botulinum toxin used or the indication for injection changes for this patient in the future. The patient is informed that we will use the same consent for Her recurrent, most likely 3 monthly injections. She demonstrated understanding and voiced agreement. I talked to the patient in detail about expectations, limitations, benefits as well as potential adverse effects of botulinum toxin injections. The patient understands that the side effects include (but are not limited to): Mouth dryness, dryness of eyes, speech and  swallowing difficulties, respiratory depression or problems breathing, weakness of muscles including more distant muscles than the ones injected, flu-like symptoms, myalgias, injection site reactions such as redness, itching, swelling, pain, and infection.  100 units of botulinum toxin type A were reconstituted using preservative-free normal saline to a concentration of 10 units per 0.1 mL and drawn up into 1 mL tuberculin syringes. NDC 6440-347 5-0 1, lot number [id number], expiration date June 2019  The patient was situated in a chair, sitting comfortably. After preparing the areas with 70% isopropyl alcohol and using a 30 gauge 1 inch needle, a total dose of 8 units of botulinum toxin type A in the form of Botox was injected into the muscles and the following distribution and quantities:  #1: 2 units in the right orbicularis oculi, upper outer eyelid.    #2: 3 units in the right orbicularis oculi, lower outer eyelid. #3: 3 units in the right mentalis.    EMG guidance was not utilized for this procedure.   A dose of 92 units out of a total dose of 100 units was discarded as unavoidable waste.    The patient tolerated the procedure well without immediate complications. She was advised to make a followup appointment for repeat injections in 3 months from now and encouraged to call us with any interim questions, concerns, problems, or updates. She was in agreement and did not have any questions prior to leaving clinic today.  Previously:  I saw her on 12/05/14 for her second botulinum toxin injection, at which time she reported improvement in her facial twitching and good tolerance of the medication. We kept her dose at 8 units at  the time. She had no side effects. Her first injection was on 08/17/2014, at which time she received a total dose of 8 units of botulinum toxin type A in the form of Botox.    I saw her on 08/07/2014, at which time she presented with a new problem. She was referred by her  primary care physician for new onset right facial spasms, affecting primarily the area around her right eyelid but sometimes also her right midface. This started in October 2015. She had overall recovered well from her right Bell's palsy in 2013. She has no history of neuromuscular disorder. She did not have any previous similar problem. She had no new facial weakness. She had no abnormal involuntary movements otherwise or abnormal neck position or contraction such as with cervical dystonia. I talked to her about proceeding with botulinum toxin injections for right hemifacial spasm.   She had a brain MRI w/wo contrast on 10/17/11: Nonspecific white matter type changes as detailed above. Slightly asymmetric appearance of the seventh cranial nerve with greater enhancement on the right possibly related to the patient's history of Bell's palsy. Mild prominence soft tissue posterior-superior nasopharynx.  Please see above discussion.  I first met her on 12/25/2013 at the request of her primary care physician, at which time she reported snoring and daytime somnolence. She also reported morning headaches and nocturia once per night on average. I advised her to have a sleep study. She had baseline sleep study on 01/30/2014 and went over her test results with her in detail today. Sleep efficiency was reduced at 75.7% with a latency to sleep of 28 minutes and wake after sleep onset prolonged at 84 minutes with mild to moderate sleep fragmentation noted. She had primarily spontaneous arousals with an index of 7.4 per hour. She had an increased percentage of stage II sleep, decreased percentage of deep sleep and a normal percentage of REM sleep at a normal REM latency. She had no significant periodic leg movements of sleep and no significant EKG changes. Mild intermittent snoring was noted. Total AHI was 4.5 per hour, rising to 8 per hour in REM sleep. She did not achieve any supine sleep. Baseline oxygen saturation was  93%, nadir was 88%. Time below 90% saturation was 44 seconds. I felt, the absence of supine sleep may underestimate her underlying sleep disordered breathing.

## 2015-06-21 ENCOUNTER — Ambulatory Visit: Payer: BLUE CROSS/BLUE SHIELD | Admitting: Neurology

## 2015-07-01 ENCOUNTER — Telehealth: Payer: Self-pay | Admitting: Neurology

## 2015-07-01 NOTE — Telephone Encounter (Signed)
Pt called to schedule Botox appt for Jan 2017. She can be reached at [phone]

## 2015-07-03 NOTE — Telephone Encounter (Signed)
Called patient and scheduled apt for 09/25/15.

## 2015-09-11 NOTE — Telephone Encounter (Signed)
Patient called with new insurance information.[employer] ZO:[id number] Self Hazeline Junker(Cathy Cranford) [phone]. Entered the new information into the portal.

## 2015-09-25 ENCOUNTER — Ambulatory Visit: Payer: BLUE CROSS/BLUE SHIELD | Admitting: Neurology

## 2015-10-08 ENCOUNTER — Ambulatory Visit (INDEPENDENT_AMBULATORY_CARE_PROVIDER_SITE_OTHER): Payer: BLUE CROSS/BLUE SHIELD | Admitting: Neurology

## 2015-10-08 VITALS — BP 136/92 | HR 78 | Resp 16 | Ht 67.0 in | Wt 225.0 lb

## 2015-10-08 DIAGNOSIS — G518 Other disorders of facial nerve: Secondary | ICD-10-CM | POA: Diagnosis not present

## 2015-10-08 DIAGNOSIS — G5139 Clonic hemifacial spasm, unspecified: Secondary | ICD-10-CM

## 2015-10-08 MED ORDER — ONABOTULINUMTOXINA 100 UNITS IJ SOLR
100.0000 [IU] | Freq: Once | INTRAMUSCULAR | Status: AC
  Administered 2015-10-08: 100 [IU] via INTRAMUSCULAR

## 2015-10-08 NOTE — Progress Notes (Signed)
Christina Rojas is a very pleasant 50 year old right-handed woman with an underlying medical history of right Bell's palsy in February 2013, [facility] ([facility]), obesity, history of pneumonia in 2013, plantar fasciitis, heart murmur with mitral prolapse, mild to moderate pulmonary insufficiency per echocardiogram, and mild asthma, who presents for repeat botulinum toxin injections for her diagnosis of right hemifacial spasms. The patient is unaccompanied today. She received her 4th injection was on 06/20/2015, at which time she reported good results. She had no side effects. We kept her dose and distribution the same.  Today, 10/08/2015: She reports doing well and having had good results with her last injection. She had no side effects. We will continue with the current dose. She is a little overdue with her injection but this is primarily because of an insurance change in the interim and she needed re-authorization.   O/E: BP 136/92 mmHg  Pulse 78  Resp 16  Ht _0  (1.702 m)  Wt 225 lb (102.059 kg)  BMI 35.23 kg/m2 She has very slight intermittent twitching around her right eyelid but not that much in right chin area. She does look better overall from when I first saw her for this. She has had intermittent twitching around the right nasolabial fold which is not apparent currently. We can consider additional injections in other right facial areas in the future. She is agreeable to this.  Written informed consent for recurrent, 3 monthly intramuscular injections with botulinum toxin for this indication has been obtained and will be scanned into the patient's electronic chart. I will re-consent if the type of botulinum toxin used or the indication for injection changes for this patient in the future. The patient is informed that we will use the same consent for Her recurrent, most likely 3 monthly injections. She demonstrated understanding and voiced agreement. I talked  to the patient in detail about expectations, limitations, benefits as well as potential adverse effects of botulinum toxin injections. The patient understands that the side effects include (but are not limited to): Mouth dryness, dryness of eyes, speech and swallowing difficulties, respiratory depression or problems breathing, weakness of muscles including more distant muscles than the ones injected, flu-like symptoms, myalgias, injection site reactions such as redness, itching, swelling, pain, and infection.  100 units of botulinum toxin type A were reconstituted using preservative-free normal saline to a concentration of 10 units per 0.1 mL and drawn up into 1 mL tuberculin syringes. [place] [id number], lot number [id number], expiration date Sep 2019  The patient was situated in a chair, sitting comfortably. After preparing the areas with 70% isopropyl alcohol and using a 30 gauge 1 inch needle, a total dose of 8 units of botulinum toxin type A in the form of Botox was injected into the muscles and the following distribution and quantities:  #1: 2 units in the right orbicularis oculi, upper outer eyelid.    #2: 3 units in the right orbicularis oculi, lower outer eyelid. #3: 3 units in the right mentalis.    EMG guidance was not utilized for this procedure.   A dose of 92 units out of a total dose of 100 units was discarded as unavoidable waste.    The patient tolerated the procedure well without immediate complications. She was advised to make a followup appointment for repeat injections in 3 months from now and encouraged to call us with any interim questions, concerns, problems, or updates. She was in agreement and did not have any questions  prior to leaving clinic today.  Previously:  Her 3rd injection on 03/21/2015 and she did well. We kept her dose the same.  I saw her on 12/05/14 for her second botulinum toxin injection, at which time she reported improvement in her facial twitching and good  tolerance of the medication. We kept her dose at 8 units at the time. She had no side effects. Her first injection was on 08/17/2014, at which time she received a total dose of 8 units of botulinum toxin type A in the form of Botox.    I saw her on 08/07/2014, at which time she presented with a new problem. She was referred by her primary care physician for new onset right facial spasms, affecting primarily the area around her right eyelid but sometimes also her right midface. This started in October 2015. She had overall recovered well from her right Bell's palsy in 2013. She has no history of neuromuscular disorder. She did not have any previous similar problem. She had no new facial weakness. She had no abnormal involuntary movements otherwise or abnormal neck position or contraction such as with cervical dystonia. I talked to her about proceeding with botulinum toxin injections for right hemifacial spasm.   She had a brain MRI w/wo contrast on 10/17/11: Nonspecific white matter type changes as detailed above. Slightly asymmetric appearance of the seventh cranial nerve with greater enhancement on the right possibly related to the patient's history of Bell's palsy. Mild prominence soft tissue posterior-superior nasopharynx.  Please see above discussion.  I first met her on 12/25/2013 at the request of her primary care physician, at which time she reported snoring and daytime somnolence. She also reported morning headaches and nocturia once per night on average. I advised her to have a sleep study. She had baseline sleep study on 01/30/2014 and went over her test results with her in detail today. Sleep efficiency was reduced at 75.7% with a latency to sleep of 28 minutes and wake after sleep onset prolonged at 84 minutes with mild to moderate sleep fragmentation noted. She had primarily spontaneous arousals with an index of 7.4 per hour. She had an increased percentage of stage II sleep, decreased percentage  of deep sleep and a normal percentage of REM sleep at a normal REM latency. She had no significant periodic leg movements of sleep and no significant EKG changes. Mild intermittent snoring was noted. Total AHI was 4.5 per hour, rising to 8 per hour in REM sleep. She did not achieve any supine sleep. Baseline oxygen saturation was 93%, nadir was 88%. Time below 90% saturation was 44 seconds. I felt, the absence of supine sleep may underestimate her underlying sleep disordered breathing.

## 2015-10-08 NOTE — Patient Instructions (Signed)
As discussed, botulinum toxin takes about 3-7 days to kick in. Please remember, this is not a pain shot, this is to gradually improve your symptoms. In some patients it takes up to 2-3 weeks to make a difference and it wears off with time. Sometimes it may wear off before it is time for the next injection. We still should wait till the next 3 monthly injection, because injecting too frequently may cause you to develop immunity to the botulinum toxin. We are looking for a reduction in your headache frequency and headache severity. Side effects to look out for are mouth dryness, dryness of the eyes, heaviness of your head or muscle weakness, rarely, speech or swallowing difficulties and very rarely breathing difficulties. Some people have transient neck pain or soreness which typically responds to over-the-counter anti-inflammatory medication and local heat application with a heat pad. If you think you have a severe reaction to the botulinum toxin, such as weakness, trouble speaking, trouble breathing, or trouble swallowing, you have to call 911 or have someone take you to the nearest emergency room. However, most people have no side effects from the injections. It is normal to have a little bit of redness and swelling around the injection sites which usually improves after a few hours. Rarely, there may be a bruise that improves on its own. Most side effects reported are very mild and resolve within 10-14 days. Please feel free to call us if you have any additional questions or concerns: [phone]. Please give us a call in about a month to report how you are doing after the botulinum toxin injections or to just give us an update. Call sooner, if you have any concerns, questions or feel that you have potential side effects. We may have to adjust the dose over time, depending on your results from this injection and your overall response over time to this medication.   

## 2015-12-25 ENCOUNTER — Telehealth: Payer: Self-pay | Admitting: Neurology

## 2015-12-25 NOTE — Telephone Encounter (Signed)
[facility] called regarding BOTOX delivery

## 2015-12-26 NOTE — Telephone Encounter (Signed)
Called and set up delivery on patients medication.

## 2016-01-13 ENCOUNTER — Encounter: Payer: Self-pay | Admitting: Neurology

## 2016-01-13 ENCOUNTER — Ambulatory Visit (INDEPENDENT_AMBULATORY_CARE_PROVIDER_SITE_OTHER): Payer: BLUE CROSS/BLUE SHIELD | Admitting: Neurology

## 2016-01-13 VITALS — BP 130/82 | HR 78 | Resp 16 | Ht 67.0 in | Wt 220.0 lb

## 2016-01-13 DIAGNOSIS — G245 Blepharospasm: Secondary | ICD-10-CM | POA: Diagnosis not present

## 2016-01-13 DIAGNOSIS — G5139 Clonic hemifacial spasm, unspecified: Secondary | ICD-10-CM

## 2016-01-13 MED ORDER — ONABOTULINUMTOXINA 100 UNITS IJ SOLR
100.0000 [IU] | Freq: Once | INTRAMUSCULAR | Status: AC
  Administered 2016-01-13: 100 [IU] via INTRAMUSCULAR

## 2016-01-13 NOTE — Progress Notes (Signed)
[name] is a very pleasant 50 year old right-handed woman with an underlying medical history of right Bell's palsy in February 2013, migraine headaches ([facility]), obesity, history of pneumonia in 2013, plantar fasciitis, heart murmur with mitral prolapse, mild to moderate pulmonary insufficiency per echocardiogram, and mild asthma, who presents for repeat botulinum toxin injections for her diagnosis of right hemifacial spasms. The patient is unaccompanied today. She received her 5th injection on 10/08/2015, at which time she reported good results. She had no side effects. We kept her dose and distribution the same.  Today, 10/08/2015: She reports doing well and having had good results with her last injection. She had no side effects. We will continue with the current dose.   O/E: Very slight intermittent twitching around her right eyelid but not that much in right chin area. She does much better overall from when I first saw her for this. She has had intermittent twitching around the right nasolabial fold, but not apparent currently. We can consider additional injections in other right facial areas in the future. She is agreeable to this.  Written informed consent for recurrent, 3 monthly intramuscular injections with botulinum toxin for this indication has been obtained and will be scanned into the patient's electronic chart. I will re-consent if the type of botulinum toxin used or the indication for injection changes for this patient in the future. The patient is informed that we will use the same consent for Her recurrent, most likely 3 monthly injections. She demonstrated understanding and voiced agreement. I talked to the patient in detail about expectations, limitations, benefits as well as potential adverse effects of botulinum toxin injections. The patient understands that the side effects include (but are not limited to): Mouth dryness, dryness of eyes, speech and  swallowing difficulties, respiratory depression or problems breathing, weakness of muscles including more distant muscles than the ones injected, flu-like symptoms, myalgias, injection site reactions such as redness, itching, swelling, pain, and infection.  100 units of botulinum toxin type A were reconstituted using preservative-free normal saline to a concentration of 10 units per 0.1 mL and drawn up into 1 mL tuberculin syringes. [employer] [phone], lot number [id number], expiration date nov 2019  The patient was situated in a chair, sitting comfortably. After preparing the areas with 70% isopropyl alcohol and using a 30 gauge 1 inch needle, a total dose of 8 units of botulinum toxin type A in the form of Botox was injected into the muscles and the following distribution and quantities:  #1: 2 units in the right orbicularis oculi, upper outer eyelid.    #2: 3 units in the right orbicularis oculi, lower outer eyelid. #3: 3 units in the right mentalis.    EMG guidance was not utilized for this procedure.    A dose of 92 units out of a total dose of 100 units was discarded as unavoidable waste.    The patient tolerated the procedure well without immediate complications. She was advised to make a followup appointment for repeat injections in 3 months from now and encouraged to call us with any interim questions, concerns, problems, or updates. She was in agreement and did not have any questions prior to leaving clinic today.  Previously:  She received her 4th injection was on 06/20/2015, at which time she reported good results. She had no side effects. We kept her dose and distribution the same.  Her 3rd injection on 03/21/2015 and she did well. We kept her dose the same.  I saw her on 12/05/14 for her second botulinum toxin injection, at which time she reported improvement in her facial twitching and good tolerance of the medication. We kept her dose at 8 units at the time. She had no side effects. Her  first injection was on 08/17/2014, at which time she received a total dose of 8 units of botulinum toxin type A in the form of Botox.    I saw her on 08/07/2014, at which time she presented with a new problem. She was referred by her primary care physician for new onset right facial spasms, affecting primarily the area around her right eyelid but sometimes also her right midface. This started in October 2015. She had overall recovered well from her right Bell's palsy in 2013. She has no history of neuromuscular disorder. She did not have any previous similar problem. She had no new facial weakness. She had no abnormal involuntary movements otherwise or abnormal neck position or contraction such as with cervical dystonia. I talked to her about proceeding with botulinum toxin injections for right hemifacial spasm.   She had a brain MRI w/wo contrast on 10/17/11: Nonspecific white matter type changes as detailed above. Slightly asymmetric appearance of the seventh cranial nerve with greater enhancement on the right possibly related to the patient's history of Bell's palsy. Mild prominence soft tissue posterior-superior nasopharynx.  Please see above discussion.  I first met her on 12/25/2013 at the request of her primary care physician, at which time she reported snoring and daytime somnolence. She also reported morning headaches and nocturia once per night on average. I advised her to have a sleep study. She had baseline sleep study on 01/30/2014 and went over her test results with her in detail today. Sleep efficiency was reduced at 75.7% with a latency to sleep of 28 minutes and wake after sleep onset prolonged at 84 minutes with mild to moderate sleep fragmentation noted. She had primarily spontaneous arousals with an index of 7.4 per hour. She had an increased percentage of stage II sleep, decreased percentage of deep sleep and a normal percentage of REM sleep at a normal REM latency. She had no significant  periodic leg movements of sleep and no significant EKG changes. Mild intermittent snoring was noted. Total AHI was 4.5 per hour, rising to 8 per hour in REM sleep. She did not achieve any supine sleep. Baseline oxygen saturation was 93%, nadir was 88%. Time below 90% saturation was 44 seconds. I felt, the absence of supine sleep may underestimate her underlying sleep disordered breathing.     

## 2016-01-13 NOTE — Patient Instructions (Signed)
As discussed, botulinum toxin takes about 3-7 days to kick in. Please remember, this is not a pain shot, this is to gradually improve your symptoms. In some patients it takes up to 2-3 weeks to make a difference and it wears off with time. Sometimes it may wear off before it is time for the next injection. We still should wait till the next 3 monthly injection, because injecting too frequently may cause you to develop immunity to the botulinum toxin. We are looking for a reduction in your headache frequency and headache severity. Side effects to look out for are mouth dryness, dryness of the eyes, heaviness of your head or muscle weakness, rarely, speech or swallowing difficulties and very rarely breathing difficulties. Some people have transient neck pain or soreness which typically responds to over-the-counter anti-inflammatory medication and local heat application with a heat pad. If you think you have a severe reaction to the botulinum toxin, such as weakness, trouble speaking, trouble breathing, or trouble swallowing, you have to call 911 or have someone take you to the nearest emergency room. However, most people have no side effects from the injections. It is normal to have a little bit of redness and swelling around the injection sites which usually improves after a few hours. Rarely, there may be a bruise that improves on its own. Most side effects reported are very mild and resolve within 10-14 days. Please feel free to call us if you have any additional questions or concerns: [phone]. Please give us a call in about a month to report how you are doing after the botulinum toxin injections or to just give us an update. Call sooner, if you have any concerns, questions or feel that you have potential side effects. We may have to adjust the dose over time, depending on your results from this injection and your overall response over time to this medication.   

## 2016-01-21 ENCOUNTER — Telehealth: Payer: Self-pay | Admitting: Neurology

## 2016-01-21 NOTE — Telephone Encounter (Signed)
Patient is calling to schedule her Botox appointment.  Please call.

## 2016-01-22 NOTE — Telephone Encounter (Signed)
Called patient and scheduled apt.

## 2016-03-30 ENCOUNTER — Telehealth: Payer: Self-pay | Admitting: Neurology

## 2016-03-30 NOTE — Telephone Encounter (Signed)
Patient called to advise [facility] needs our office to call to set up delivery of BOTOX, benefits approved for July even though BOTOX is in August. Today is the last day to call [phone].

## 2016-03-30 NOTE — Telephone Encounter (Signed)
Called and scheduled delivery  

## 2016-04-28 ENCOUNTER — Ambulatory Visit (INDEPENDENT_AMBULATORY_CARE_PROVIDER_SITE_OTHER): Payer: BLUE CROSS/BLUE SHIELD | Admitting: Neurology

## 2016-04-28 ENCOUNTER — Encounter: Payer: Self-pay | Admitting: Neurology

## 2016-04-28 VITALS — BP 142/98 | HR 78 | Resp 16 | Ht 67.0 in | Wt 222.0 lb

## 2016-04-28 DIAGNOSIS — G5139 Clonic hemifacial spasm, unspecified: Secondary | ICD-10-CM

## 2016-04-28 DIAGNOSIS — G245 Blepharospasm: Secondary | ICD-10-CM | POA: Diagnosis not present

## 2016-04-28 DIAGNOSIS — G518 Other disorders of facial nerve: Secondary | ICD-10-CM

## 2016-04-28 MED ORDER — ONABOTULINUMTOXINA 100 UNITS IJ SOLR
100.0000 [IU] | Freq: Once | INTRAMUSCULAR | Status: AC
  Administered 2016-04-28: 100 [IU] via INTRAMUSCULAR

## 2016-04-28 NOTE — Progress Notes (Signed)
[name] is a very pleasant 50 year old right-handed woman with an underlying medical history of right Bell's palsy in February 2013, [facility] ([facility]), obesity, history of pneumonia in 2013, plantar fasciitis, heart murmur with mitral prolapse, mild to moderate pulmonary insufficiency per echocardiogram, and mild asthma, who presents for repeat botulinum toxin injections for her diagnosis of right hemifacial spasms. The patient is unaccompanied today. She received her 6th injection on 01/13/2016, at which time she reported doing well we continued with the current dose and distribution.   Today, 04/27/2016: She reports generally doing well and having had good results with her last injection. She had no side effects. However, she noticed additional twitching around the lower aspect of her eyelid this time around and wonders if we should increase the dose. We mutually agreed to add a small dose around the right outer canthus. Unfortunately, she went home from work with a headache today, she took Tylenol and took some rest, feels a little better, attributes this to a combination of stress, as well as weather changes. Otherwise she looks well today.    O/E: BP (!) 142/98   Pulse 78   Resp 16   Ht 5' 7"  (1.702 m)   Wt 222 lb (100.7 kg)   BMI 34.77 kg/m   Very slight intermittent twitching around her right eyelid but not that much in right chin area. She does much better overall from when I first saw her for this. She has had intermittent twitching around the right nasolabial fold in the past, but not apparent currently.    Written informed consent for recurrent, 3 monthly intramuscular injections with botulinum toxin for this indication has been obtained and will be scanned into the patient's electronic chart. I will re-consent if the type of botulinum toxin used or the indication for injection changes for this patient in the future. The patient is informed that  we will use the same consent for Her recurrent, most likely 3 monthly injections. She demonstrated understanding and voiced agreement. I talked to the patient in detail about expectations, limitations, benefits as well as potential adverse effects of botulinum toxin injections. The patient understands that the side effects include (but are not limited to): Mouth dryness, dryness of eyes, speech and swallowing difficulties, respiratory depression or problems breathing, weakness of muscles including more distant muscles than the ones injected, flu-like symptoms, myalgias, injection site reactions such as redness, itching, swelling, pain, and infection.   100 units of botulinum toxin type A were reconstituted using preservative-free normal saline to a concentration of 10 units per 0.1 mL and drawn up into 1 mL tuberculin syringes. [employer] [phone], Lot number [id number], expiration date March 2020   The patient was situated in a chair, sitting comfortably. After preparing the areas with 70% isopropyl alcohol and using a 30 gauge 1 inch needle, a total dose of 10 units of botulinum toxin type A in the form of Botox was injected into the muscles and the following distribution and quantities:   #1: 2 units in the right orbicularis oculi, upper outer eyelid.    #2: 3 units in the right orbicularis oculi, lower outer eyelid. #3: 3 units in the right mentalis.   #4: 2 units in the right outer canthus area.   EMG guidance was not utilized for this procedure.     A dose of 90 units out of a total dose of 100 units was discarded as unavoidable waste.    The  patient tolerated the procedure well without immediate complications. She was advised to make a followup appointment for repeat injections in 3 months from now and encouraged to call us with any interim questions, concerns, problems, or updates. She was in agreement and did not have any questions prior to leaving clinic today.   Previously:   She received  her fifth injection on 10/08/2015, at which time she reported good results. She had no side effects. We kept her dose and distribution the same.  She received her 4th injection on 06/20/2015, at which time she reported good results. She had no side effects. We kept her dose and distribution the same.   Her 3rd injection on 03/21/2015 and she did well. We kept her dose the same.   I saw her on 12/05/14 for her second botulinum toxin injection, at which time she reported improvement in her facial twitching and good tolerance of the medication. We kept her dose at 8 units at the time. She had no side effects. Her first injection was on 08/17/2014, at which time she received a total dose of 8 units of botulinum toxin type A in the form of Botox.     I saw her on 08/07/2014, at which time she presented with a new problem. She was referred by her primary care physician for new onset right facial spasms, affecting primarily the area around her right eyelid but sometimes also her right midface. This started in October 2015. She had overall recovered well from her right Bell's palsy in 2013. She has no history of neuromuscular disorder. She did not have any previous similar problem. She had no new facial weakness. She had no abnormal involuntary movements otherwise or abnormal neck position or contraction such as with cervical dystonia. I talked to her about proceeding with botulinum toxin injections for right hemifacial spasm.    She had a brain MRI w/wo contrast on 10/17/11: Nonspecific white matter type changes as detailed above. Slightly asymmetric appearance of the seventh cranial nerve with greater enhancement on the right possibly related to the patient's history of Bell's palsy. Mild prominence soft tissue posterior-superior nasopharynx.  Please see above discussion.   I first met her on 12/25/2013 at the request of her primary care physician, at which time she reported snoring and daytime somnolence. She  also reported morning headaches and nocturia once per night on average. I advised her to have a sleep study. She had baseline sleep study on 01/30/2014 and went over her test results with her in detail today. Sleep efficiency was reduced at 75.7% with a latency to sleep of 28 minutes and wake after sleep onset prolonged at 84 minutes with mild to moderate sleep fragmentation noted. She had primarily spontaneous arousals with an index of 7.4 per hour. She had an increased percentage of stage II sleep, decreased percentage of deep sleep and a normal percentage of REM sleep at a normal REM latency. She had no significant periodic leg movements of sleep and no significant EKG changes. Mild intermittent snoring was noted. Total AHI was 4.5 per hour, rising to 8 per hour in REM sleep. She did not achieve any supine sleep. Baseline oxygen saturation was 93%, nadir was 88%. Time below 90% saturation was 44 seconds. I felt, the absence of supine sleep may underestimate her underlying sleep disordered breathing.

## 2016-04-28 NOTE — Patient Instructions (Signed)
As discussed, botulinum toxin takes about 3-7 days to kick in. Please remember, this is not a pain shot, this is to gradually improve your symptoms. In some patients it takes up to 2-3 weeks to make a difference and it wears off with time. Sometimes it may wear off before it is time for the next injection. We still should wait till the next 3 monthly injection, because injecting too frequently may cause you to develop immunity to the botulinum toxin. We are looking for a reduction in your headache frequency and headache severity. Side effects to look out for are mouth dryness, dryness of the eyes, heaviness of your head or muscle weakness, rarely, speech or swallowing difficulties and very rarely breathing difficulties. Some people have transient neck pain or soreness which typically responds to over-the-counter anti-inflammatory medication and local heat application with a heat pad. If you think you have a severe reaction to the botulinum toxin, such as weakness, trouble speaking, trouble breathing, or trouble swallowing, you have to call 911 or have someone take you to the nearest emergency room. However, most people have no side effects from the injections. It is normal to have a little bit of redness and swelling around the injection sites which usually improves after a few hours. Rarely, there may be a bruise that improves on its own. Most side effects reported are very mild and resolve within 10-14 days. Please feel free to call us if you have any additional questions or concerns: [phone]. Please give us a call in about a month to report how you are doing after the botulinum toxin injections or to just give us an update. Call sooner, if you have any concerns, questions or feel that you have potential side effects. We may have to adjust the dose over time, depending on your results from this injection and your overall response over time to this medication.   

## 2016-07-14 ENCOUNTER — Telehealth: Payer: Self-pay | Admitting: Neurology

## 2016-07-14 NOTE — Telephone Encounter (Signed)
Patient called to advise, [facility], states this is unusual. Please call to advise.

## 2016-07-15 NOTE — Telephone Encounter (Signed)
I think she was probably referring to the specialty pharmacy. I called and they are pending her refill. I called the patient back and gave her their phone number and advised her if she hasn't heard anything by 11/17 she can call them and give consent.

## 2016-07-28 ENCOUNTER — Ambulatory Visit (INDEPENDENT_AMBULATORY_CARE_PROVIDER_SITE_OTHER): Payer: BLUE CROSS/BLUE SHIELD | Admitting: Neurology

## 2016-07-28 ENCOUNTER — Encounter: Payer: Self-pay | Admitting: Neurology

## 2016-07-28 VITALS — BP 132/80 | HR 80 | Resp 16 | Ht 67.0 in | Wt 225.0 lb

## 2016-07-28 DIAGNOSIS — G245 Blepharospasm: Secondary | ICD-10-CM | POA: Diagnosis not present

## 2016-07-28 DIAGNOSIS — G5139 Clonic hemifacial spasm, unspecified: Secondary | ICD-10-CM

## 2016-07-28 DIAGNOSIS — G513 Clonic hemifacial spasm: Secondary | ICD-10-CM | POA: Diagnosis not present

## 2016-07-28 MED ORDER — ONABOTULINUMTOXINA 100 UNITS IJ SOLR
100.0000 [IU] | Freq: Once | INTRAMUSCULAR | Status: AC
  Administered 2016-07-28: 100 [IU] via INTRAMUSCULAR

## 2016-07-28 NOTE — Patient Instructions (Signed)
As discussed, botulinum toxin takes about 3-7 days to kick in. Please remember, this is not a pain shot, this is to gradually improve your symptoms. In some patients it takes up to 2-3 weeks to make a difference and it wears off with time. Sometimes it may wear off before it is time for the next injection. We still should wait till the next 3 monthly injection, because injecting too frequently may cause you to develop immunity to the botulinum toxin. We are looking for a reduction in your headache frequency and headache severity. Side effects to look out for are mouth dryness, dryness of the eyes, heaviness of your head or muscle weakness, rarely, speech or swallowing difficulties and very rarely breathing difficulties. Some people have transient neck pain or soreness which typically responds to over-the-counter anti-inflammatory medication and local heat application with a heat pad. If you think you have a severe reaction to the botulinum toxin, such as weakness, trouble speaking, trouble breathing, or trouble swallowing, you have to call 911 or have someone take you to the nearest emergency room. However, most people have no side effects from the injections. It is normal to have a little bit of redness and swelling around the injection sites which usually improves after a few hours. Rarely, there may be a bruise that improves on its own. Most side effects reported are very mild and resolve within 10-14 days. Please feel free to call us if you have any additional questions or concerns: [phone]. Please give us a call in about a month to report how you are doing after the botulinum toxin injections or to just give us an update. Call sooner, if you have any concerns, questions or feel that you have potential side effects. We may have to adjust the dose over time, depending on your results from this injection and your overall response over time to this medication.   

## 2016-07-28 NOTE — Progress Notes (Signed)
[name] is a very pleasant 50 year old right-handed woman with an underlying medical history of right Bell's palsy in February 2013, [facility]), obesity, history of pneumonia in 2013, plantar fasciitis, heart murmur with mitral prolapse, mild to moderate pulmonary insufficiency per echocardiogram, and mild asthma, who presents for repeat botulinum toxin injections for her diagnosis of right hemifacial spasms. The patient is unaccompanied today. She received her 7th injection on 04/28/2016, at which time she reported doing well. She had no side effects. She has noticed additional twitching around the lower aspect of her eyelid and was wondering if we should increase her dose. We mutually agreed to add a small dose around the right outer canthus. Unfortunately, she has taken off from work earlier because of a migrainous headache. Otherwise she was doing well.    Today, 07/28/2016: She reports doing well, adding the additional dose at the outer eyelid helped. No SEs, no new issues.    O/E: BP 132/80   Pulse 80   Resp 16   Ht 5' 7"  (1.702 m)   Wt 225 lb (102.1 kg)   BMI 35.24 kg/m :    She has very slight intermittent twitching around her right eyelid but not that much in right chin area, one or 2 brief twitches. She does much better overall from when I first saw her for this. She has had intermittent twitching around the right nasolabial fold in the past, but not apparent currently.     Written informed consent for recurrent, 3 monthly intramuscular injections with botulinum toxin for this indication has been obtained and will be scanned into the patient's electronic chart. I will re-consent if the type of botulinum toxin used or the indication for injection changes for this patient in the future. The patient is informed that we will use the same consent for Her recurrent, most likely 3 monthly injections. She demonstrated understanding and voiced  agreement. I talked to the patient in detail about expectations, limitations, benefits as well as potential adverse effects of botulinum toxin injections. The patient understands that the side effects include (but are not limited to): Mouth dryness, dryness of eyes, speech and swallowing difficulties, respiratory depression or problems breathing, weakness of muscles including more distant muscles than the ones injected, flu-like symptoms, myalgias, injection site reactions such as redness, itching, swelling, pain, and infection.   100 units of botulinum toxin type A were reconstituted using preservative-free normal saline to a concentration of 10 units per 0.1 mL and drawn up into 1 mL tuberculin syringes. [place] [phone], Lot number [id number], expiration date June 2020 (specialty pharm).   The patient was situated in a chair, sitting comfortably. After preparing the areas with 70% isopropyl alcohol and using a 30 gauge 1 inch needle, a total dose of 10 units of botulinum toxin type A in the form of Botox was injected into the muscles and the following distribution and quantities:    #1: 2 units in the right orbicularis oculi, upper outer eyelid.    #2: 3 units in the right orbicularis oculi, lower outer eyelid. #3: 2 units in the right outer canthus area. #4: 3 units in the right mentalis.    EMG guidance was not utilized for this procedure.     A dose of 90 units out of a total dose of 100 units was discarded as unavoidable waste.    The patient tolerated the procedure well without immediate complications. She was advised to make a followup  appointment for repeat injections in 3 months from now and encouraged to call us with any interim questions, concerns, problems, or updates. She was in agreement and did not have any questions prior to leaving clinic today.   Previously:  She received her sixth injection on 01/13/2016, at which time she reported doing well, we did not change her dose. She had  no side effects.   She received her fifth injection on 10/08/2015, at which time she reported good results. She had no side effects. We kept her dose and distribution the same.   She received her 4th injection on 06/20/2015, at which time she reported good results. She had no side effects. We kept her dose and distribution the same.   Her 3rd injection on 03/21/2015 and she did well. We kept her dose the same.   I saw her on 12/05/14 for her second botulinum toxin injection, at which time she reported improvement in her facial twitching and good tolerance of the medication. We kept her dose at 8 units at the time. She had no side effects. Her first injection was on 08/17/2014, at which time she received a total dose of 8 units of botulinum toxin type A in the form of Botox.     I saw her on 08/07/2014, at which time she presented with a new problem. She was referred by her primary care physician for new onset right facial spasms, affecting primarily the area around her right eyelid but sometimes also her right midface. This started in October 2015. She had overall recovered well from her right Bell's palsy in 2013. She has no history of neuromuscular disorder. She did not have any previous similar problem. She had no new facial weakness. She had no abnormal involuntary movements otherwise or abnormal neck position or contraction such as with cervical dystonia. I talked to her about proceeding with botulinum toxin injections for right hemifacial spasm.    She had a brain MRI w/wo contrast on 10/17/11: Nonspecific white matter type changes as detailed above. Slightly asymmetric appearance of the seventh cranial nerve with greater enhancement on the right possibly related to the patient's history of Bell's palsy. Mild prominence soft tissue posterior-superior nasopharynx.  Please see above discussion.   I first met her on 12/25/2013 at the request of her primary care physician, at which time she reported  snoring and daytime somnolence. She also reported morning headaches and nocturia once per night on average. I advised her to have a sleep study. She had baseline sleep study on 01/30/2014 and went over her test results with her in detail today. Sleep efficiency was reduced at 75.7% with a latency to sleep of 28 minutes and wake after sleep onset prolonged at 84 minutes with mild to moderate sleep fragmentation noted. She had primarily spontaneous arousals with an index of 7.4 per hour. She had an increased percentage of stage II sleep, decreased percentage of deep sleep and a normal percentage of REM sleep at a normal REM latency. She had no significant periodic leg movements of sleep and no significant EKG changes. Mild intermittent snoring was noted. Total AHI was 4.5 per hour, rising to 8 per hour in REM sleep. She did not achieve any supine sleep. Baseline oxygen saturation was 93%, nadir was 88%. Time below 90% saturation was 44 seconds. I felt, the absence of supine sleep may underestimate her underlying sleep disordered breathing.

## 2016-10-29 ENCOUNTER — Telehealth: Payer: Self-pay | Admitting: Neurology

## 2016-10-29 ENCOUNTER — Ambulatory Visit (INDEPENDENT_AMBULATORY_CARE_PROVIDER_SITE_OTHER): Payer: BLUE CROSS/BLUE SHIELD | Admitting: Neurology

## 2016-10-29 ENCOUNTER — Encounter: Payer: Self-pay | Admitting: Neurology

## 2016-10-29 VITALS — BP 144/87 | HR 82 | Resp 20 | Ht 68.0 in | Wt 225.0 lb

## 2016-10-29 DIAGNOSIS — G513 Clonic hemifacial spasm: Secondary | ICD-10-CM

## 2016-10-29 DIAGNOSIS — G5139 Clonic hemifacial spasm, unspecified: Secondary | ICD-10-CM

## 2016-10-29 DIAGNOSIS — G245 Blepharospasm: Secondary | ICD-10-CM | POA: Diagnosis not present

## 2016-10-29 MED ORDER — ONABOTULINUMTOXINA 100 UNITS IJ SOLR
100.0000 [IU] | Freq: Once | INTRAMUSCULAR | Status: AC
  Administered 2016-10-29: 100 [IU] via INTRAMUSCULAR

## 2016-10-29 NOTE — Telephone Encounter (Signed)
Per Frances FurbishAthar, pt needs 3 months (around 01/29/2017), or repeat botox, establ pt.

## 2016-10-29 NOTE — Progress Notes (Signed)
Christina Rojas is a very pleasant 51 year old right-handed woman with an underlying medical history of right Bell's palsy in February 2013, migraine headaches ([facility]), obesity, history of pneumonia in 2013, plantar fasciitis, heart murmur with mitral prolapse, mild to moderate pulmonary insufficiency per echocardiogram, and mild asthma, who presents for repeat botulinum toxin injections for her diagnosis of right hemifacial spasms. The patient is unaccompanied today. She received her eighth injection on 07/28/2016, at which time she reported doing well, adding the additional dose at the outer eyelid was helpful and she reported no side effects.  Today, 10/29/2016: She reports doing well, in fact she feels, this last injection was rather effective and no signs of twitching in the interim, unless she is really fatigued or stressed out. No SEs, no new issues.    O/E: BP (!) 144/87   Pulse 82   Resp 20   Ht 5' 8" (1.727 m)   Wt 225 lb (102.1 kg)   BMI 34.21 kg/m :    She has no obvious twitching around her right or right chin area, one or 2 brief twitches. She does look overall much better from when I first saw her for this. She has had intermittent twitching around the right nasolabial fold in the past, but not apparent currently and we had talked about adding a dose in that area, but really did not need to thus far - she looks very good today.     Written informed consent for recurrent, 3 monthly intramuscular injections with botulinum toxin for this indication has been obtained and will be scanned into the patient's electronic chart. I will re-consent if the type of botulinum toxin used or the indication for injection changes for this patient in the future. The patient is informed that we will use the same consent for Her recurrent, most likely 3 monthly injections. She demonstrated understanding and voiced agreement. I talked to the patient in detail about expectations,  limitations, benefits as well as potential adverse effects of botulinum toxin injections. The patient understands that the side effects include (but are not limited to): Mouth dryness, dryness of eyes, speech and swallowing difficulties, respiratory depression or problems breathing, weakness of muscles including more distant muscles than the ones injected, flu-like symptoms, myalgias, injection site reactions such as redness, itching, swelling, pain, and infection.   100 units of botulinum toxin type A were reconstituted using preservative-free normal saline to a concentration of 10 units per 0.1 mL and drawn up into 1 mL tuberculin syringes. [place] 9826-4158-30, Lot number [id number], expiration date sep 2020 (specialty pharm).   The patient was situated in a chair, sitting comfortably. After preparing the areas with 70% isopropyl alcohol and using a 30 gauge 1 inch needle, a total dose of 10 units of botulinum toxin type A in the form of Botox was injected into the muscles and the following distribution and quantities:    #1: 2 units in the right orbicularis oculi, upper outer eyelid.    #2: 3 units in the right orbicularis oculi, lower outer eyelid. #3: 2 units in the right outer canthus area. #4: 3 units in the right mentalis.    EMG guidance was not utilized for this procedure.     A dose of 90 units out of a total dose of 100 units was discarded as unavoidable waste.    The patient tolerated the procedure well without immediate complications. She was advised to make a followup appointment for repeat injections in  3 months from now and encouraged to call us with any interim questions, concerns, problems, or updates. She was in agreement and did not have any questions prior to leaving clinic today.   Previously:  She had her 7th injection on 04/28/2016, at which time she reported doing well. She had no side effects. She has noticed additional twitching around the lower aspect of her eyelid and was  wondering if we should increase her dose. We mutually agreed to add a small dose around the right outer canthus. Unfortunately, she had to take off from work earlier because of a migraine headache. Otherwise she was doing well.   She received her sixth injection on 01/13/2016, at which time she reported doing well, we did not change her dose. She had no side effects.    She received her fifth injection on 10/08/2015, at which time she reported good results. She had no side effects. We kept her dose and distribution the same.   She received her 4th injection on 06/20/2015, at which time she reported good results. She had no side effects. We kept her dose and distribution the same.   Her 3rd injection on 03/21/2015 and she did well. We kept her dose the same.   I saw her on 12/05/14 for her second botulinum toxin injection, at which time she reported improvement in her facial twitching and good tolerance of the medication. We kept her dose at 8 units at the time. She had no side effects. Her first injection was on 08/17/2014, at which time she received a total dose of 8 units of botulinum toxin type A in the form of Botox.     I saw her on 08/07/2014, at which time she presented with a new problem. She was referred by her primary care physician for new onset right facial spasms, affecting primarily the area around her right eyelid but sometimes also her right midface. This started in October 2015. She had overall recovered well from her right Bell's palsy in 2013. She has no history of neuromuscular disorder. She did not have any previous similar problem. She had no new facial weakness. She had no abnormal involuntary movements otherwise or abnormal neck position or contraction such as with cervical dystonia. I talked to her about proceeding with botulinum toxin injections for right hemifacial spasm.    She had a brain MRI w/wo contrast on 10/17/11: Nonspecific white matter type changes as detailed above.  Slightly asymmetric appearance of the seventh cranial nerve with greater enhancement on the right possibly related to the patient's history of Bell's palsy. Mild prominence soft tissue posterior-superior nasopharynx.  Please see above discussion.   I first met her on 12/25/2013 at the request of her primary care physician, at which time she reported snoring and daytime somnolence. She also reported morning headaches and nocturia once per night on average. I advised her to have a sleep study. She had baseline sleep study on 01/30/2014 and went over her test results with her in detail today. Sleep efficiency was reduced at 75.7% with a latency to sleep of 28 minutes and wake after sleep onset prolonged at 84 minutes with mild to moderate sleep fragmentation noted. She had primarily spontaneous arousals with an index of 7.4 per hour. She had an increased percentage of stage II sleep, decreased percentage of deep sleep and a normal percentage of REM sleep at a normal REM latency. She had no significant periodic leg movements of sleep and no significant EKG changes. Mild  intermittent snoring was noted. Total AHI was 4.5 per hour, rising to 8 per hour in REM sleep. She did not achieve any supine sleep. Baseline oxygen saturation was 93%, nadir was 88%. Time below 90% saturation was 44 seconds. I felt, the absence of supine sleep may underestimate her underlying sleep disordered breathing.

## 2016-10-29 NOTE — Patient Instructions (Signed)
As discussed, botulinum toxin takes about 3-7 days to kick in. Please remember, this is not a pain shot, this is to gradually improve your symptoms. In some patients it takes up to 2-3 weeks to make a difference and it wears off with time. Sometimes it may wear off before it is time for the next injection. We still should wait till the next 3 monthly injection, because injecting too frequently may cause you to develop immunity to the botulinum toxin. We are looking for a reduction in your headache frequency and headache severity. Side effects to look out for are mouth dryness, dryness of the eyes, heaviness of your head or muscle weakness, rarely, speech or swallowing difficulties and very rarely breathing difficulties. Some people have transient neck pain or soreness which typically responds to over-the-counter anti-inflammatory medication and local heat application with a heat pad. If you think you have a severe reaction to the botulinum toxin, such as weakness, trouble speaking, trouble breathing, or trouble swallowing, you have to call 911 or have someone take you to the nearest emergency room. However, most people have no side effects from the injections. It is normal to have a little bit of redness and swelling around the injection sites which usually improves after a few hours. Rarely, there may be a bruise that improves on its own. Most side effects reported are very mild and resolve within 10-14 days. Please feel free to call us if you have any additional questions or concerns: [phone]. Please give us a call in about a month to report how you are doing after the botulinum toxin injections or to just give us an update. Call sooner, if you have any concerns, questions or feel that you have potential side effects. We may have to adjust the dose over time, depending on your results from this injection and your overall response over time to this medication.   

## 2016-10-30 NOTE — Telephone Encounter (Signed)
I called and scheduled the patient for her injection.  °

## 2017-01-29 ENCOUNTER — Telehealth: Payer: Self-pay | Admitting: Neurology

## 2017-01-29 NOTE — Telephone Encounter (Signed)
I called to check status of patient's Botox medication. It is still pending insurance verification. He called over to the insurance department and they stated that they are working on it and it would take 24-48 business hours. I asked he mark it urgent since the apt's apt is coming up.He said that he would mark it urgent. I spoke with representative Ron.

## 2017-02-01 NOTE — Telephone Encounter (Signed)
I called and spoke with the pharmacy regarding checking the status of botox. The medication is still pending insurance, they stated that they had to start over since the month changed. She told me it should be ready for today. I spoke with representative Derancher.

## 2017-02-03 ENCOUNTER — Ambulatory Visit (INDEPENDENT_AMBULATORY_CARE_PROVIDER_SITE_OTHER): Payer: BLUE CROSS/BLUE SHIELD | Admitting: Neurology

## 2017-02-03 ENCOUNTER — Telehealth: Payer: Self-pay | Admitting: Neurology

## 2017-02-03 ENCOUNTER — Encounter: Payer: Self-pay | Admitting: Neurology

## 2017-02-03 VITALS — BP 133/88 | HR 79 | Ht 68.0 in | Wt 220.0 lb

## 2017-02-03 DIAGNOSIS — G513 Clonic hemifacial spasm: Secondary | ICD-10-CM

## 2017-02-03 DIAGNOSIS — G5139 Clonic hemifacial spasm, unspecified: Secondary | ICD-10-CM

## 2017-02-03 DIAGNOSIS — G245 Blepharospasm: Secondary | ICD-10-CM | POA: Diagnosis not present

## 2017-02-03 MED ORDER — ONABOTULINUMTOXINA 100 UNITS IJ SOLR
100.0000 [IU] | Freq: Once | INTRAMUSCULAR | Status: AC
  Administered 2017-02-03: 100 [IU] via INTRAMUSCULAR

## 2017-02-03 NOTE — Telephone Encounter (Signed)
Return in about 3 months (around 05/06/2017), or botox repeat.

## 2017-02-03 NOTE — Progress Notes (Signed)
[name] is a very pleasant 51 year old right-handed woman with an underlying medical history of right Bell's palsy in February 2013, [facility] ([facility]), obesity, history of pneumonia in 2013, plantar fasciitis, heart murmur with mitral prolapse, hx of pulmonary insufficiency (per echo), and mild asthma, who presents for repeat botulinum toxin injections for her diagnosis of right hemifacial spasms. The patient is unaccompanied today. She received her ninth injection on 10/29/2016, at which time she reported doing well, felt that her last injection was rather effective and she had no signs of twitching in the interim, unless she was really fatigued or stressed out, she reported no side effects or new medical issues in the interim, was doing rather well overall.    Today, 02/03/2017: She reports doing well, in fact she feels, the last injection was rather effective and no signs of twitching in the interim, unless she is really fatigued or stressed out. No SEs, no new issues.    O/E: BP 133/88   Pulse 79   Ht 5' 8" (1.727 m)   Wt 220 lb (99.8 kg)   BMI 33.45 kg/m  She has no obvious twitching around her right and very little in the right chin area, one or 2 brief twitches. She does look well overall, much better from when I first saw her for this. She has had intermittent twitching around the right nasolabial fold in the past, but not apparent currently and we had talked about adding a dose in that area, but really did not need to thus far.    Written informed consent for recurrent, 3 monthly intramuscular injections with botulinum toxin for this indication has been obtained and will be scanned into the patient's electronic chart. I will re-consent if the type of botulinum toxin used or the indication for injection changes for this patient in the future. The patient is informed that we will use the same consent for Her recurrent, most likely 3 monthly injections.  She demonstrated understanding and voiced agreement. I talked to the patient in detail about expectations, limitations, benefits as well as potential adverse effects of botulinum toxin injections. The patient understands that the side effects include (but are not limited to): Mouth dryness, dryness of eyes, speech and swallowing difficulties, respiratory depression or problems breathing, weakness of muscles including more distant muscles than the ones injected, flu-like symptoms, myalgias, injection site reactions such as redness, itching, swelling, pain, and infection.   100 units of botulinum toxin type A were reconstituted using preservative-free normal saline to a concentration of 10 units per 0.1 mL and drawn up into 1 mL tuberculin syringes. [place] [id number], Lot number [id number], expiration date 07/2019 (specialty pharm).   The patient was situated in a chair, sitting comfortably. After preparing the areas with 70% isopropyl alcohol and using a 30 gauge 1 inch needle, a total dose of 10 units of botulinum toxin type A in the form of Botox was injected into the muscles and the following distribution and quantities:    #1: 2 units in the right orbicularis oculi, upper outer eyelid.    #2: 3 units in the right orbicularis oculi, lower outer eyelid. #3: 2 units in the right outer canthus area. #4: 3 units in the right mentalis.    EMG guidance was not utilized for this procedure.     A dose of 90 units out of a total dose of 100 units was discarded as unavoidable waste.    The patient tolerated the  procedure well without immediate complications. She was advised to make a followup appointment for repeat injections in 3 months from now and encouraged to call us with any interim questions, concerns, problems, or updates. She was in agreement and did not have any questions prior to leaving clinic today.     Previously:   She received her eighth injection on 07/28/2016, at which time she reported  doing well, adding the additional dose at the outer eyelid was helpful and she reported no side effects.  She had her 7th injection on 04/28/2016, at which time she reported doing well. She had no side effects. She has noticed additional twitching around the lower aspect of her eyelid and was wondering if we should increase her dose. We mutually agreed to add a small dose around the right outer canthus. Unfortunately, she had to take off from work earlier because of a migraine headache. Otherwise she was doing well.    She received her sixth injection on 01/13/2016, at which time she reported doing well, we did not change her dose. She had no side effects.    She received her fifth injection on 10/08/2015, at which time she reported good results. She had no side effects. We kept her dose and distribution the same.   She received her 4th injection on 06/20/2015, at which time she reported good results. She had no side effects. We kept her dose and distribution the same.   Her 3rd injection on 03/21/2015 and she did well. We kept her dose the same.   I saw her on 12/05/14 for her second botulinum toxin injection, at which time she reported improvement in her facial twitching and good tolerance of the medication. We kept her dose at 8 units at the time. She had no side effects. Her first injection was on 08/17/2014, at which time she received a total dose of 8 units of botulinum toxin type A in the form of Botox.     I saw her on 08/07/2014, at which time she presented with a new problem. She was referred by her primary care physician for new onset right facial spasms, affecting primarily the area around her right eyelid but sometimes also her right midface. This started in October 2015. She had overall recovered well from her right Bell's palsy in 2013. She has no history of neuromuscular disorder. She did not have any previous similar problem. She had no new facial weakness. She had no abnormal  involuntary movements otherwise or abnormal neck position or contraction such as with cervical dystonia. I talked to her about proceeding with botulinum toxin injections for right hemifacial spasm.    She had a brain MRI w/wo contrast on 10/17/11: Nonspecific white matter type changes as detailed above. Slightly asymmetric appearance of the seventh cranial nerve with greater enhancement on the right possibly related to the patient's history of Bell's palsy. Mild prominence soft tissue posterior-superior nasopharynx.  Please see above discussion.   I first met her on 12/25/2013 at the request of her primary care physician, at which time she reported snoring and daytime somnolence. She also reported morning headaches and nocturia once per night on average. I advised her to have a sleep study. She had baseline sleep study on 01/30/2014 and went over her test results with her in detail today. Sleep efficiency was reduced at 75.7% with a latency to sleep of 28 minutes and wake after sleep onset prolonged at 84 minutes with mild to moderate sleep fragmentation noted. She  had primarily spontaneous arousals with an index of 7.4 per hour. She had an increased percentage of stage II sleep, decreased percentage of deep sleep and a normal percentage of REM sleep at a normal REM latency. She had no significant periodic leg movements of sleep and no significant EKG changes. Mild intermittent snoring was noted. Total AHI was 4.5 per hour, rising to 8 per hour in REM sleep. She did not achieve any supine sleep. Baseline oxygen saturation was 93%, nadir was 88%. Time below 90% saturation was 44 seconds. I felt, the absence of supine sleep may underestimate her underlying sleep disordered breathing.

## 2017-02-03 NOTE — Patient Instructions (Signed)
As discussed, botulinum toxin takes about 3-7 days to kick in. Please remember, this is not a pain shot, this is to gradually improve your symptoms. In some patients it takes up to 2-3 weeks to make a difference and it wears off with time. Sometimes it may wear off before it is time for the next injection. We still should wait till the next 3 monthly injection, because injecting too frequently may cause you to develop immunity to the botulinum toxin. We are looking for a reduction in your headache frequency and headache severity. Side effects to look out for are mouth dryness, dryness of the eyes, heaviness of your head or muscle weakness, rarely, speech or swallowing difficulties and very rarely breathing difficulties. Some people have transient neck pain or soreness which typically responds to over-the-counter anti-inflammatory medication and local heat application with a heat pad. If you think you have a severe reaction to the botulinum toxin, such as weakness, trouble speaking, trouble breathing, or trouble swallowing, you have to call 911 or have someone take you to the nearest emergency room. However, most people have no side effects from the injections. It is normal to have a little bit of redness and swelling around the injection sites which usually improves after a few hours. Rarely, there may be a bruise that improves on its own. Most side effects reported are very mild and resolve within 10-14 days. Please feel free to call us if you have any additional questions or concerns: [phone]. Please give us a call in about a month to report how you are doing after the botulinum toxin injections or to just give us an update. Call sooner, if you have any concerns, questions or feel that you have potential side effects. We may have to adjust the dose over time, depending on your results from this injection and your overall response over time to this medication.   

## 2017-05-10 ENCOUNTER — Other Ambulatory Visit: Payer: Self-pay | Admitting: Neurology

## 2017-05-11 ENCOUNTER — Telehealth: Payer: Self-pay | Admitting: Neurology
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
## 2017-05-13 ENCOUNTER — Ambulatory Visit: Payer: BLUE CROSS/BLUE SHIELD | Admitting: Neurology

## 2017-05-27 ENCOUNTER — Encounter: Payer: Self-pay | Admitting: Neurology

## 2017-05-27 ENCOUNTER — Ambulatory Visit (INDEPENDENT_AMBULATORY_CARE_PROVIDER_SITE_OTHER): Payer: BLUE CROSS/BLUE SHIELD | Admitting: Neurology

## 2017-05-27 VITALS — BP 130/87 | HR 74 | Ht 68.0 in | Wt 224.0 lb

## 2017-05-27 DIAGNOSIS — G245 Blepharospasm: Secondary | ICD-10-CM

## 2017-05-27 DIAGNOSIS — G5139 Clonic hemifacial spasm, unspecified: Secondary | ICD-10-CM

## 2017-05-27 DIAGNOSIS — G513 Clonic hemifacial spasm: Secondary | ICD-10-CM

## 2017-05-27 MED ORDER — ONABOTULINUMTOXINA 100 UNITS IJ SOLR
100.0000 [IU] | Freq: Once | INTRAMUSCULAR | Status: AC
  Administered 2017-05-27: 100 [IU] via INTRAMUSCULAR

## 2017-05-27 NOTE — Patient Instructions (Signed)
As discussed, botulinum toxin takes about 3-7 days to kick in. Please remember, this is not a pain shot, this is to gradually improve your symptoms. In some patients it takes up to 2-3 weeks to make a difference and it wears off with time. Sometimes it may wear off before it is time for the next injection. We still should wait till the next 3 monthly injection, because injecting too frequently may cause you to develop immunity to the botulinum toxin. We are looking for a reduction in your headache frequency and headache severity. Side effects to look out for are mouth dryness, dryness of the eyes, heaviness of your head or muscle weakness, rarely, speech or swallowing difficulties and very rarely breathing difficulties. Some people have transient neck pain or soreness which typically responds to over-the-counter anti-inflammatory medication and local heat application with a heat pad. If you think you have a severe reaction to the botulinum toxin, such as weakness, trouble speaking, trouble breathing, or trouble swallowing, you have to call 911 or have someone take you to the nearest emergency room. However, most people have no side effects from the injections. It is normal to have a little bit of redness and swelling around the injection sites which usually improves after a few hours. Rarely, there may be a bruise that improves on its own. Most side effects reported are very mild and resolve within 10-14 days. Please feel free to call us if you have any additional questions or concerns: [phone].  
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
## 2017-05-27 NOTE — Progress Notes (Deleted)
Subjective:    Patient ID: Christina Rojas is a 51 y.o. female.  HPI {Common ambulatory SmartLinks:[id number]}  Review of Systems  Neurological:       Pt presents today for Botox. Pt states things have been going well since she was last seen. No complaints.     Objective:  Neurological Exam  Physical Exam  Assessment:   ***  Plan:   ***

## 2017-11-05 ENCOUNTER — Other Ambulatory Visit: Payer: Self-pay | Admitting: Internal Medicine

## 2017-11-05 DIAGNOSIS — I341 Nonrheumatic mitral (valve) prolapse: Secondary | ICD-10-CM

## 2017-11-10 ENCOUNTER — Other Ambulatory Visit: Payer: Self-pay

## 2017-11-10 ENCOUNTER — Ambulatory Visit (HOSPITAL_COMMUNITY): Payer: BLUE CROSS/BLUE SHIELD | Attending: Cardiology

## 2017-11-10 DIAGNOSIS — J45909 Unspecified asthma, uncomplicated: Secondary | ICD-10-CM | POA: Diagnosis not present

## 2017-11-10 DIAGNOSIS — I341 Nonrheumatic mitral (valve) prolapse: Secondary | ICD-10-CM

## 2017-11-10 DIAGNOSIS — E669 Obesity, unspecified: Secondary | ICD-10-CM | POA: Insufficient documentation

## 2017-11-10 DIAGNOSIS — G510 Bell's palsy: Secondary | ICD-10-CM | POA: Insufficient documentation

## 2020-04-11 ENCOUNTER — Other Ambulatory Visit: Payer: Self-pay | Admitting: Internal Medicine

## 2020-04-11 ENCOUNTER — Ambulatory Visit
Admission: RE | Admit: 2020-04-11 | Discharge: 2020-04-11 | Disposition: A | Discharge status: Home / Self Care | Payer: BLUE CROSS/BLUE SHIELD | Source: Ambulatory Visit | Attending: Internal Medicine | Admitting: Internal Medicine

## 2020-04-11 DIAGNOSIS — R1011 Right upper quadrant pain: Secondary | ICD-10-CM

## 2020-04-11 MED ORDER — IOPAMIDOL (ISOVUE-300) INJECTION 61%
100.0000 mL | Freq: Once | INTRAVENOUS | Status: AC | PRN
  Administered 2020-04-11: 100 mL via INTRAVENOUS

## 2020-04-18 ENCOUNTER — Ambulatory Visit: Payer: Self-pay | Admitting: General Surgery

## 2020-04-20 ENCOUNTER — Other Ambulatory Visit (HOSPITAL_COMMUNITY)
Admission: RE | Admit: 2020-04-20 | Discharge: 2020-04-20 | Disposition: A | Discharge status: Home / Self Care | Payer: BC Managed Care – PPO | Source: Ambulatory Visit | Attending: General Surgery | Admitting: General Surgery

## 2020-04-20 DIAGNOSIS — Z01812 Encounter for preprocedural laboratory examination: Secondary | ICD-10-CM | POA: Insufficient documentation

## 2020-04-20 DIAGNOSIS — Z20822 Contact with and (suspected) exposure to covid-19: Secondary | ICD-10-CM | POA: Diagnosis not present

## 2020-04-20 LAB — SARS CORONAVIRUS 2 (TAT 6-24 HRS): SARS Coronavirus 2: NEGATIVE

## 2020-04-21 NOTE — Progress Notes (Signed)
Patient denies shortness of breath, fever, cough or chest pain.  PCP - Dr Waynard Edwards Cardiologist - n/a  Chest x-ray - n/a EKG - DOS 04/22/20 Stress Test - n/a ECHO - 11/10/17 Cardiac Cath - n/a  ERAS: Clears til 9:30 am DOS, no drink.  STOP now taking any Aspirin (unless otherwise instructed by your surgeon), Aleve, Naproxen, Ibuprofen, Motrin, Advil, Goody's, BC's, all herbal medications, fish oil, and all vitamins.   Coronavirus Screening Covid test on 04/20/20 was negative.  Patient verbalized understanding of instructions that were given via phone.

## 2020-04-22 ENCOUNTER — Encounter (HOSPITAL_COMMUNITY)
Admission: AD | Disposition: A | Discharge status: Home / Self Care | Payer: Self-pay | Source: Ambulatory Visit | Attending: General Surgery

## 2020-04-22 ENCOUNTER — Ambulatory Visit (HOSPITAL_COMMUNITY): Payer: BC Managed Care – PPO | Admitting: Anesthesiology

## 2020-04-22 ENCOUNTER — Encounter (HOSPITAL_COMMUNITY): Payer: Self-pay | Admitting: General Surgery

## 2020-04-22 ENCOUNTER — Other Ambulatory Visit (HOSPITAL_COMMUNITY): Payer: BC Managed Care – PPO

## 2020-04-22 ENCOUNTER — Ambulatory Visit (HOSPITAL_COMMUNITY): Payer: BC Managed Care – PPO

## 2020-04-22 ENCOUNTER — Ambulatory Visit (HOSPITAL_COMMUNITY)
Admission: AD | Admit: 2020-04-22 | Discharge: 2020-04-22 | Disposition: A | Discharge status: Home / Self Care | Payer: BC Managed Care – PPO | Source: Ambulatory Visit | Attending: General Surgery | Admitting: General Surgery

## 2020-04-22 ENCOUNTER — Other Ambulatory Visit: Payer: Self-pay

## 2020-04-22 DIAGNOSIS — K8020 Calculus of gallbladder without cholecystitis without obstruction: Secondary | ICD-10-CM | POA: Diagnosis present

## 2020-04-22 DIAGNOSIS — Z811 Family history of alcohol abuse and dependence: Secondary | ICD-10-CM | POA: Diagnosis not present

## 2020-04-22 DIAGNOSIS — I341 Nonrheumatic mitral (valve) prolapse: Secondary | ICD-10-CM | POA: Insufficient documentation

## 2020-04-22 DIAGNOSIS — J45909 Unspecified asthma, uncomplicated: Secondary | ICD-10-CM | POA: Insufficient documentation

## 2020-04-22 DIAGNOSIS — Z8261 Family history of arthritis: Secondary | ICD-10-CM | POA: Diagnosis not present

## 2020-04-22 DIAGNOSIS — Z79899 Other long term (current) drug therapy: Secondary | ICD-10-CM | POA: Diagnosis not present

## 2020-04-22 DIAGNOSIS — I10 Essential (primary) hypertension: Secondary | ICD-10-CM | POA: Insufficient documentation

## 2020-04-22 DIAGNOSIS — K8010 Calculus of gallbladder with chronic cholecystitis without obstruction: Secondary | ICD-10-CM | POA: Insufficient documentation

## 2020-04-22 DIAGNOSIS — Z8249 Family history of ischemic heart disease and other diseases of the circulatory system: Secondary | ICD-10-CM | POA: Diagnosis not present

## 2020-04-22 DIAGNOSIS — Z836 Family history of other diseases of the respiratory system: Secondary | ICD-10-CM | POA: Insufficient documentation

## 2020-04-22 DIAGNOSIS — G43909 Migraine, unspecified, not intractable, without status migrainosus: Secondary | ICD-10-CM | POA: Diagnosis not present

## 2020-04-22 HISTORY — PX: CHOLECYSTECTOMY: SHX55

## 2020-04-22 LAB — CBC
HCT: 40.7 % (ref 36.0–46.0)
Hemoglobin: 12.9 g/dL (ref 12.0–15.0)
MCH: 29.9 pg (ref 26.0–34.0)
MCHC: 31.7 g/dL (ref 30.0–36.0)
MCV: 94.2 fL (ref 80.0–100.0)
Platelets: 211 10*3/uL (ref 150–400)
RBC: 4.32 MIL/uL (ref 3.87–5.11)
RDW: 11.9 % (ref 11.5–15.5)
WBC: 5.1 10*3/uL (ref 4.0–10.5)
nRBC: 0 % (ref 0.0–0.2)

## 2020-04-22 LAB — COMPREHENSIVE METABOLIC PANEL
ALT: 22 U/L (ref 0–44)
AST: 20 U/L (ref 15–41)
Albumin: 3.9 g/dL (ref 3.5–5.0)
Alkaline Phosphatase: 41 U/L (ref 38–126)
Anion gap: 11 (ref 5–15)
BUN: 11 mg/dL (ref 6–20)
CO2: 22 mmol/L (ref 22–32)
Calcium: 8.9 mg/dL (ref 8.9–10.3)
Chloride: 102 mmol/L (ref 98–111)
Creatinine, Ser: 0.73 mg/dL (ref 0.44–1.00)
GFR calc Af Amer: >60 mL/min (ref >60)
GFR calc non Af Amer: >60 mL/min (ref >60)
Glucose, Bld: 107 mg/dL — ABNORMAL HIGH (ref 70–99)
Potassium: 3.8 mmol/L (ref 3.5–5.1)
Sodium: 135 mmol/L (ref 135–145)
Total Bilirubin: 0.7 mg/dL (ref 0.3–1.2)
Total Protein: 6.7 g/dL (ref 6.5–8.1)

## 2020-04-22 LAB — POCT PREGNANCY, URINE: Preg Test, Ur: NEGATIVE

## 2020-04-22 SURGERY — LAPAROSCOPIC CHOLECYSTECTOMY WITH INTRAOPERATIVE CHOLANGIOGRAM
Anesthesia: General | Site: Abdomen

## 2020-04-22 MED ORDER — OXYCODONE HCL 5 MG PO TABS
5.0000 mg | ORAL_TABLET | Freq: Once | ORAL | Status: AC | PRN
  Administered 2020-04-22: 5 mg via ORAL

## 2020-04-22 MED ORDER — SODIUM CHLORIDE 0.9 % IV SOLN
INTRAVENOUS | Status: DC | PRN
  Administered 2020-04-22: 13 mL

## 2020-04-22 MED ORDER — ACETAMINOPHEN 500 MG PO TABS: 1000.0000 mg | ORAL_TABLET | ORAL | Status: AC

## 2020-04-22 MED ORDER — FENTANYL CITRATE (PF) 250 MCG/5ML IJ SOLN
INTRAMUSCULAR | Status: DC | PRN
Start: 2020-04-22 — End: 2020-04-22
  Administered 2020-04-22 (×2): 25 ug via INTRAVENOUS
  Administered 2020-04-22: 100 ug via INTRAVENOUS
  Administered 2020-04-22: 50 ug via INTRAVENOUS

## 2020-04-22 MED ORDER — FENTANYL CITRATE (PF) 100 MCG/2ML IJ SOLN
INTRAMUSCULAR | Status: AC
  Filled 2020-04-22: qty 2

## 2020-04-22 MED ORDER — OXYCODONE HCL 5 MG/5ML PO SOLN: 5.0000 mg | Freq: Once | ORAL | Status: AC | PRN

## 2020-04-22 MED ORDER — PROMETHAZINE HCL 25 MG/ML IJ SOLN: 6.2500 mg | INTRAMUSCULAR | Status: DC | PRN

## 2020-04-22 MED ORDER — ORAL CARE MOUTH RINSE: 15.0000 mL | Freq: Once | OROMUCOSAL | Status: AC

## 2020-04-22 MED ORDER — CHLORHEXIDINE GLUCONATE 0.12 % MT SOLN: 15.0000 mL | Freq: Once | OROMUCOSAL | Status: AC

## 2020-04-22 MED ORDER — CELECOXIB 200 MG PO CAPS
ORAL_CAPSULE | ORAL | Status: AC
  Administered 2020-04-22: 200 mg via ORAL
  Filled 2020-04-22: qty 1

## 2020-04-22 MED ORDER — LIDOCAINE 2% (20 MG/ML) 5 ML SYRINGE
INTRAMUSCULAR | Status: AC
  Filled 2020-04-22: qty 5

## 2020-04-22 MED ORDER — CEFAZOLIN SODIUM-DEXTROSE 2-4 GM/100ML-% IV SOLN
2.0000 g | INTRAVENOUS | Status: AC
  Administered 2020-04-22: 2 g via INTRAVENOUS

## 2020-04-22 MED ORDER — PROPOFOL 10 MG/ML IV BOLUS
INTRAVENOUS | Status: AC
  Filled 2020-04-22: qty 20

## 2020-04-22 MED ORDER — SUGAMMADEX SODIUM 200 MG/2ML IV SOLN
INTRAVENOUS | Status: DC | PRN
  Administered 2020-04-22: 200 mg via INTRAVENOUS

## 2020-04-22 MED ORDER — FENTANYL CITRATE (PF) 100 MCG/2ML IJ SOLN
25.0000 ug | INTRAMUSCULAR | Status: DC | PRN
  Administered 2020-04-22 (×2): 50 ug via INTRAVENOUS

## 2020-04-22 MED ORDER — MIDAZOLAM HCL 2 MG/2ML IJ SOLN
INTRAMUSCULAR | Status: AC
  Filled 2020-04-22: qty 2

## 2020-04-22 MED ORDER — CHLORHEXIDINE GLUCONATE CLOTH 2 % EX PADS: 6.0000 | MEDICATED_PAD | Freq: Once | CUTANEOUS | Status: DC

## 2020-04-22 MED ORDER — BUPIVACAINE HCL (PF) 0.25 % IJ SOLN
INTRAMUSCULAR | Status: DC | PRN
  Administered 2020-04-22: 13 mL

## 2020-04-22 MED ORDER — ONDANSETRON HCL 4 MG/2ML IJ SOLN
INTRAMUSCULAR | Status: DC | PRN
  Administered 2020-04-22: 4 mg via INTRAVENOUS

## 2020-04-22 MED ORDER — SODIUM CHLORIDE 0.9 % IR SOLN
Status: DC | PRN
  Administered 2020-04-22: 1000 mL

## 2020-04-22 MED ORDER — CEFAZOLIN SODIUM-DEXTROSE 2-4 GM/100ML-% IV SOLN
INTRAVENOUS | Status: AC
  Filled 2020-04-22: qty 100

## 2020-04-22 MED ORDER — FENTANYL CITRATE (PF) 250 MCG/5ML IJ SOLN
INTRAMUSCULAR | Status: AC
  Filled 2020-04-22: qty 5

## 2020-04-22 MED ORDER — CHLORHEXIDINE GLUCONATE 0.12 % MT SOLN
OROMUCOSAL | Status: AC
  Administered 2020-04-22: 15 mL via OROMUCOSAL
  Filled 2020-04-22: qty 15

## 2020-04-22 MED ORDER — ACETAMINOPHEN 500 MG PO TABS
ORAL_TABLET | ORAL | Status: AC
  Administered 2020-04-22: 1000 mg via ORAL
  Filled 2020-04-22: qty 2

## 2020-04-22 MED ORDER — ARTIFICIAL TEARS OPHTHALMIC OINT
TOPICAL_OINTMENT | OPHTHALMIC | Status: AC
  Filled 2020-04-22: qty 3.5

## 2020-04-22 MED ORDER — PROPOFOL 10 MG/ML IV BOLUS
INTRAVENOUS | Status: DC | PRN
  Administered 2020-04-22: 200 mg via INTRAVENOUS

## 2020-04-22 MED ORDER — LACTATED RINGERS IV SOLN: INTRAVENOUS | Status: DC

## 2020-04-22 MED ORDER — LIDOCAINE 2% (20 MG/ML) 5 ML SYRINGE
INTRAMUSCULAR | Status: DC | PRN
  Administered 2020-04-22: 100 mg via INTRAVENOUS

## 2020-04-22 MED ORDER — ROCURONIUM BROMIDE 10 MG/ML (PF) SYRINGE
PREFILLED_SYRINGE | INTRAVENOUS | Status: AC
  Filled 2020-04-22: qty 10

## 2020-04-22 MED ORDER — 0.9 % SODIUM CHLORIDE (POUR BTL) OPTIME
TOPICAL | Status: DC | PRN
  Administered 2020-04-22: 1000 mL

## 2020-04-22 MED ORDER — DEXAMETHASONE SODIUM PHOSPHATE 10 MG/ML IJ SOLN
INTRAMUSCULAR | Status: DC | PRN
  Administered 2020-04-22: 10 mg via INTRAVENOUS

## 2020-04-22 MED ORDER — GABAPENTIN 300 MG PO CAPS
ORAL_CAPSULE | ORAL | Status: AC
  Administered 2020-04-22: 300 mg via ORAL
  Filled 2020-04-22: qty 1

## 2020-04-22 MED ORDER — ROCURONIUM BROMIDE 10 MG/ML (PF) SYRINGE
PREFILLED_SYRINGE | INTRAVENOUS | Status: DC | PRN
  Administered 2020-04-22: 60 mg via INTRAVENOUS

## 2020-04-22 MED ORDER — BUPIVACAINE HCL (PF) 0.25 % IJ SOLN
INTRAMUSCULAR | Status: AC
  Filled 2020-04-22: qty 30

## 2020-04-22 MED ORDER — OXYCODONE HCL 5 MG PO TABS
ORAL_TABLET | ORAL | Status: AC
  Filled 2020-04-22: qty 1

## 2020-04-22 MED ORDER — GABAPENTIN 300 MG PO CAPS: 300.0000 mg | ORAL_CAPSULE | ORAL | Status: AC

## 2020-04-22 MED ORDER — HYDROCODONE-ACETAMINOPHEN 5-325 MG PO TABS: 1.0000 | ORAL_TABLET | Freq: Four times a day (QID) | ORAL | 0 refills | Status: AC | PRN

## 2020-04-22 MED ORDER — MIDAZOLAM HCL 2 MG/2ML IJ SOLN
INTRAMUSCULAR | Status: DC | PRN
  Administered 2020-04-22: 2 mg via INTRAVENOUS

## 2020-04-22 MED ORDER — SUCCINYLCHOLINE CHLORIDE 200 MG/10ML IV SOSY
PREFILLED_SYRINGE | INTRAVENOUS | Status: AC
  Filled 2020-04-22: qty 10

## 2020-04-22 MED ORDER — CELECOXIB 200 MG PO CAPS: 200.0000 mg | ORAL_CAPSULE | ORAL | Status: AC

## 2020-04-22 SURGICAL SUPPLY — 43 items
ADH SKN CLS APL DERMABOND .7 (GAUZE/BANDAGES/DRESSINGS) ×1
ADH SKN CLS LQ APL DERMABOND (GAUZE/BANDAGES/DRESSINGS) ×1
APL PRP STRL LF DISP 70% ISPRP (MISCELLANEOUS) ×1
APPLIER CLIP 5 13 M/L LIGAMAX5 (MISCELLANEOUS) ×2
APR CLP MED LRG 5 ANG JAW (MISCELLANEOUS) ×1
BAG SPEC RTRVL LRG 6X4 10 (ENDOMECHANICALS) ×1
CANISTER SUCT 3000ML PPV (MISCELLANEOUS) ×2 IMPLANT
CATH REDDICK CHOLANGI 4FR 50CM (CATHETERS) ×1 IMPLANT
CHLORAPREP W/TINT 26 (MISCELLANEOUS) ×2 IMPLANT
CLIP APPLIE 5 13 M/L LIGAMAX5 (MISCELLANEOUS) ×1 IMPLANT
COVER MAYO STAND STRL (DRAPES) ×2 IMPLANT
COVER SURGICAL LIGHT HANDLE (MISCELLANEOUS) ×2 IMPLANT
DERMABOND ADHESIVE PROPEN (GAUZE/BANDAGES/DRESSINGS) ×1
DERMABOND ADVANCED (GAUZE/BANDAGES/DRESSINGS) ×1
DERMABOND ADVANCED .7 DNX12 (GAUZE/BANDAGES/DRESSINGS) ×1 IMPLANT
DERMABOND ADVANCED .7 DNX6 (GAUZE/BANDAGES/DRESSINGS) IMPLANT
DRAPE C-ARM 42X120 X-RAY (DRAPES) ×2 IMPLANT
DRAPE C-ARM 42X72 X-RAY (DRAPES) ×1 IMPLANT
ELECT REM PT RETURN 9FT ADLT (ELECTROSURGICAL) ×2
ELECTRODE REM PT RTRN 9FT ADLT (ELECTROSURGICAL) ×1 IMPLANT
GLOVE BIO SURGEON STRL SZ7.5 (GLOVE) ×2 IMPLANT
GOWN STRL REUS W/ TWL LRG LVL3 (GOWN DISPOSABLE) ×3 IMPLANT
GOWN STRL REUS W/TWL LRG LVL3 (GOWN DISPOSABLE) ×6
IV CATH 14GX2 1/4 (CATHETERS) ×2 IMPLANT
KIT BASIN OR (CUSTOM PROCEDURE TRAY) ×2 IMPLANT
KIT TURNOVER KIT B (KITS) ×2 IMPLANT
NS IRRIG 1000ML POUR BTL (IV SOLUTION) ×2 IMPLANT
PAD ARMBOARD 7.5X6 YLW CONV (MISCELLANEOUS) ×2 IMPLANT
POUCH SPECIMEN RETRIEVAL 10MM (ENDOMECHANICALS) ×2 IMPLANT
SCISSORS LAP 5X35 DISP (ENDOMECHANICALS) ×2 IMPLANT
SET IRRIG TUBING LAPAROSCOPIC (IRRIGATION / IRRIGATOR) ×2 IMPLANT
SET TUBE SMOKE EVAC HIGH FLOW (TUBING) ×2 IMPLANT
SLEEVE ENDOPATH XCEL 5M (ENDOMECHANICALS) ×4 IMPLANT
SPECIMEN JAR SMALL (MISCELLANEOUS) ×2 IMPLANT
SUT MNCRL AB 4-0 PS2 18 (SUTURE) ×2 IMPLANT
SUT MON AB 4-0 PC3 18 (SUTURE) ×1 IMPLANT
SUT VIC AB 2-0 UR6 27 (SUTURE) ×1 IMPLANT
TOWEL GREEN STERILE (TOWEL DISPOSABLE) ×2 IMPLANT
TOWEL GREEN STERILE FF (TOWEL DISPOSABLE) ×2 IMPLANT
TRAY LAPAROSCOPIC MC (CUSTOM PROCEDURE TRAY) ×2 IMPLANT
TROCAR XCEL BLUNT TIP 100MML (ENDOMECHANICALS) ×2 IMPLANT
TROCAR XCEL NON-BLD 5MMX100MML (ENDOMECHANICALS) ×2 IMPLANT
WATER STERILE IRR 1000ML POUR (IV SOLUTION) ×2 IMPLANT

## 2020-04-22 NOTE — Transfer of Care (Signed)
Immediate Anesthesia Transfer of Care Note  Patient: Christina Rojas  Procedure(s) Performed: LAPAROSCOPIC CHOLECYSTECTOMY WITH INTRAOPERATIVE CHOLANGIOGRAM (N/A Abdomen)  Patient Location: PACU  Anesthesia Type:General  Level of Consciousness: awake, alert  and oriented  Airway & Oxygen Therapy: Patient Spontanous Breathing and Patient connected to nasal cannula oxygen  Post-op Assessment: Report given to RN, Post -op Vital signs reviewed and stable and Patient moving all extremities X 4  Post vital signs: Reviewed and stable  Last Vitals:  Vitals Value Taken Time  BP 131/90 04/22/20 1325  Temp 36.4 C 04/22/20 1325  Pulse 69 04/22/20 1332  Resp 16 04/22/20 1332  SpO2 96 % 04/22/20 1332  Vitals shown include unvalidated device data.  Last Pain:  Vitals:   04/22/20 1325  TempSrc:   PainSc: 6       Patients Stated Pain Goal: 4 (04/22/20 1325)  Complications: No complications documented.

## 2020-04-22 NOTE — Anesthesia Procedure Notes (Signed)
Procedure Name: Intubation Date/Time: 04/22/2020 12:03 PM Performed by: Verdie Drown, CRNA Pre-anesthesia Checklist: Patient identified, Emergency Drugs available, Suction available and Patient being monitored Patient Re-evaluated:Patient Re-evaluated prior to induction Oxygen Delivery Method: Circle System Utilized Preoxygenation: Pre-oxygenation with 100% oxygen Induction Type: IV induction Ventilation: Mask ventilation without difficulty and Oral airway inserted - appropriate to patient size Laryngoscope Size: Mac and 3 Grade View: Grade II Tube type: Oral Tube size: 7.0 mm Number of attempts: 1 Airway Equipment and Method: Stylet and Oral airway Placement Confirmation: ETT inserted through vocal cords under direct vision,  positive ETCO2 and breath sounds checked- equal and bilateral Secured at: 21 cm Tube secured with: Tape Dental Injury: Teeth and Oropharynx as per pre-operative assessment

## 2020-04-22 NOTE — Op Note (Signed)
04/22/2020  1:04 PM  PATIENT:  Christina Rojas  54 y.o. female  PRE-OPERATIVE DIAGNOSIS:  GALLSTONES  POST-OPERATIVE DIAGNOSIS:  GALLSTONES  PROCEDURE:  Procedure(s) with comments: LAPAROSCOPIC CHOLECYSTECTOMY WITH INTRAOPERATIVE CHOLANGIOGRAM (N/A) - RNFA  SURGEON:  Surgeon(s) and Role:    * Griselda Miner, MD - Primary  PHYSICIAN ASSISTANT:   ASSISTANTS: Patrici Ranks, RNFA  ANESTHESIA:   local and general  EBL:  minimal   BLOOD ADMINISTERED:none  DRAINS: none   LOCAL MEDICATIONS USED:  MARCAINE     SPECIMEN:  Source of Specimen:  gallbladder  DISPOSITION OF SPECIMEN:  PATHOLOGY  COUNTS:  YES  TOURNIQUET:  * No tourniquets in log *  DICTATION: .Dragon Dictation     Procedure: After informed consent was obtained the patient was brought to the operating room and placed in the supine position on the operating room table. After adequate induction of general anesthesia the patient's abdomen was prepped with ChloraPrep allowed to dry and draped in usual sterile manner. An appropriate timeout was performed. The area below the umbilicus was infiltrated with quarter percent  Marcaine. A small incision was made with a 15 blade knife. The incision was carried down through the subcutaneous tissue bluntly with a hemostat and [employer] retractors. The linea alba was identified. The linea alba was incised with a 15 blade knife and each side was grasped with Coker clamps. The preperitoneal space was then probed with a hemostat until the peritoneum was opened and access was gained to the abdominal cavity. A 0 Vicryl pursestring stitch was placed in the fascia surrounding the opening. A Hassan cannula was then placed through the opening and anchored in place with the previously placed Vicryl purse string stitch. The abdomen was insufflated with carbon dioxide without difficulty. A laparoscope was inserted through the [facility] cannula in the right upper quadrant was inspected. Next the  epigastric region was infiltrated with % Marcaine. A small incision was made with a 15 blade knife. A 5 mm port was placed bluntly through this incision into the abdominal cavity under direct vision. Next 2 sites were chosen laterally on the right side of the abdomen for placement of 5 mm ports. Each of these areas was infiltrated with quarter percent Marcaine. Small stab incisions were made with a 15 blade knife. 5 mm ports were then placed bluntly through these incisions into the abdominal cavity under direct vision without difficulty. A blunt grasper was placed through the lateralmost 5 mm port and used to grasp the dome of the gallbladder and elevated anteriorly and superiorly. Another blunt grasper was placed through the other 5 mm port and used to retract the body and neck of the gallbladder. A dissector was placed through the epigastric port and using the electrocautery the peritoneal reflection at the gallbladder neck was opened. Blunt dissection was then carried out in this area until the gallbladder neck-cystic duct junction was readily identified and a good window was created. A single clip was placed on the gallbladder neck. A small  ductotomy was made just below the clip with laparoscopic scissors. A 14-gauge Angiocath was then placed through the anterior abdominal wall under direct vision. A Reddick cholangiogram catheter was then placed through the Angiocath and flushed. The catheter was then placed in the cystic duct and anchored in place with a clip. A cholangiogram was obtained that showed no filling defects good emptying into the duodenum an adequate length on the cystic duct. The anchoring clip and catheters were then removed from  the patient. 3 clips were placed proximally on the cystic duct and the duct was divided between the 2 sets of clips. Posterior to this the cystic artery was identified and again dissected bluntly in a circumferential manner until a good window  was created. 2 clips  were placed proximally and one distally on the artery and the artery was divided between the 2 sets of clips. Next a laparoscopic hook cautery device was used to separate the gallbladder from the liver bed. Prior to completely detaching the gallbladder from the liver bed the liver bed was inspected and several small bleeding points were coagulated with the electrocautery until the area was completely hemostatic. The gallbladder was then detached the rest of it from the liver bed without difficulty. A laparoscopic bag was inserted through the hassan port. The laparoscope was moved to the epigastric port. The gallbladder was placed within the bag and the bag was sealed.  The bag with the gallbladder was then removed with the [facility] cannula through the infraumbilical port without difficulty. There appeared to be one large stone in the gallbladder. The fascial defect was then closed with the previously placed Vicryl pursestring stitch as well as with another figure-of-eight 0 Vicryl stitch. The liver bed was inspected again and found to be hemostatic. The abdomen was irrigated with copious amounts of saline until the effluent was clear. The ports were then removed under direct vision without difficulty and were found to be hemostatic. The gas was allowed to escape. The skin incisions were all closed with interrupted 4-0 Monocryl subcuticular stitches. Dermabond dressings were applied. The patient tolerated the procedure well. At the end of the case all needle sponge and instrument counts were correct. The patient was then awakened and taken to recovery in stable condition  PLAN OF CARE: Discharge to home after PACU  PATIENT DISPOSITION:  PACU - hemodynamically stable.   Delay start of Pharmacological VTE agent (>24hrs) due to surgical blood loss or risk of bleeding: not applicable

## 2020-04-22 NOTE — Interval H&P Note (Signed)
History and Physical Interval Note:  04/22/2020 11:45 AM  Christina Rojas  has presented today for surgery, with the diagnosis of GALLSTONES.  The various methods of treatment have been discussed with the patient and family. After consideration of risks, benefits and other options for treatment, the patient has consented to  Procedure(s) with comments: LAPAROSCOPIC CHOLECYSTECTOMY WITH INTRAOPERATIVE CHOLANGIOGRAM (N/A) - RNFA as a surgical intervention.  The patient's history has been reviewed, patient examined, no change in status, stable for surgery.  I have reviewed the patient's chart and labs.  Questions were answered to the patient's satisfaction.     Chevis Pretty III

## 2020-04-22 NOTE — Anesthesia Preprocedure Evaluation (Addendum)
Anesthesia Evaluation  Patient identified by MRN, date of birth, ID band Patient awake    Reviewed: Allergy & Precautions, NPO status , Patient's Chart, lab work & pertinent test results  History of Anesthesia Complications Negative for: history of anesthetic complications  Airway Mallampati: II  TM Distance: >3 FB Neck ROM: Full    Dental no notable dental hx.    Pulmonary asthma ,    Pulmonary exam normal        Cardiovascular hypertension, Pt. on medications Normal cardiovascular exam  TTE 2019: EF 60-65%, grade 2 diastolic dysfunction, MVP, mild RAE    Neuro/Psych  Headaches, negative psych ROS   GI/Hepatic negative GI ROS, Neg liver ROS,   Endo/Other  negative endocrine ROS  Renal/GU negative Renal ROS  negative genitourinary   Musculoskeletal negative musculoskeletal ROS (+)   Abdominal   Peds  Hematology negative hematology ROS (+)   Anesthesia Other Findings Day of surgery medications reviewed with patient.  Reproductive/Obstetrics negative OB ROS                           Anesthesia Physical Anesthesia Plan  ASA: II  Anesthesia Plan: General   Post-op Pain Management:    Induction: Intravenous  PONV Risk Score and Plan: 4 or greater and Ondansetron, Dexamethasone, Treatment may vary due to age or medical condition and Midazolam  Airway Management Planned: Oral ETT  Additional Equipment: None  Intra-op Plan:   Post-operative Plan: Extubation in OR  Informed Consent: I have reviewed the patients History and Physical, chart, labs and discussed the procedure including the risks, benefits and alternatives for the proposed anesthesia with the patient or authorized representative who has indicated his/her understanding and acceptance.     Dental advisory given  Plan Discussed with: CRNA  Anesthesia Plan Comments:        Anesthesia Quick Evaluation

## 2020-04-22 NOTE — Anesthesia Postprocedure Evaluation (Signed)
Anesthesia Post Note  Patient: Christina Rojas  Procedure(s) Performed: LAPAROSCOPIC CHOLECYSTECTOMY WITH INTRAOPERATIVE CHOLANGIOGRAM (N/A Abdomen)     Patient location during evaluation: PACU Anesthesia Type: General Level of consciousness: awake and alert and oriented Pain management: pain level controlled Vital Signs Assessment: post-procedure vital signs reviewed and stable Respiratory status: spontaneous breathing, nonlabored ventilation and respiratory function stable Cardiovascular status: blood pressure returned to baseline Postop Assessment: no apparent nausea or vomiting Anesthetic complications: no   No complications documented.  Last Vitals:  Vitals:   04/22/20 1354 04/22/20 1410  BP: 117/73 118/72  Pulse: 64 64  Resp: 16 15  Temp:    SpO2: 94% 96%    Last Pain:  Vitals:   04/22/20 1355  TempSrc:   PainSc: 4                  Kaylyn Layer

## 2020-04-22 NOTE — H&P (Signed)
Christina Rojas  Location: [facility] Patient #: [id number] DOB: 27-Jul-1966 Married / Language: Undefined / Race: White Female   History of Present Illness  The patient is a 54 year old female who presents with abdominal pain. we are asked to see the patient in consultation by Dr. Rodrigo Ran to evaluate her for gallstones. the patient is a 54 year old white female who has been experiencing intermittent right upper quadrant pain for the last 2 months. She describes the pain as severe. The pain is been associated with nausea and vomiting. The pain has been radiating into her back. The pain will last for about 2 hours before subsiding. The pain is been occurring about every 2 weeks. She underwent a CT scan to evaluate this and was found to have a 2 cm gallstone in the fundus of the gallbladder. There was no gallbladder wall thickening or ductal dilatation. She has not had her liver functions checked.   Past Surgical History  Shoulder Surgery  Bilateral.  Diagnostic Studies History  Colonoscopy  1-5 years ago Mammogram  within last year Pap Smear  1-5 years ago  Allergies  No Known Drug Allergies   Medication History  Sprintec 28 (0.25-35MG -MCG Tablet, Oral) Active. Olmesartan Medoxomil (20MG  Tablet, Oral) Active. Medications Reconciled  Social History  Alcohol use  Moderate alcohol use. Caffeine use  Carbonated beverages. No drug use  Tobacco use  Never smoker.  Family History  Alcohol Abuse  Mother. Arthritis  Father. Hypertension  Father. Respiratory Condition  Father.  Pregnancy / Birth History  Age at menarche  15 years. Contraceptive History  Oral contraceptives. Gravida  4 Maternal age  24-25 Para  3 Regular periods   Other Problems  Cholelithiasis  Heart murmur  High blood pressure  Migraine Headache     Review of Systems  General Not Present- Appetite Loss, Chills, Fatigue, Fever, Night Sweats, Weight Gain and  Weight Loss. Skin Not Present- Change in Wart/Mole, Dryness, Hives, Jaundice, New Lesions, Non-Healing Wounds, Rash and Ulcer. HEENT Not Present- Earache, Hearing Loss, Hoarseness, Nose Bleed, Oral Ulcers, Ringing in the Ears, Seasonal Allergies, Sinus Pain, Sore Throat, Visual Disturbances, Wears glasses/contact lenses and Yellow Eyes. Respiratory Not Present- Bloody sputum, Chronic Cough, Difficulty Breathing, Snoring and Wheezing. Breast Not Present- Breast Mass, Breast Pain, Nipple Discharge and Skin Changes. Cardiovascular Not Present- Chest Pain, Difficulty Breathing Lying Down, Leg Cramps, Palpitations, Rapid Heart Rate, Shortness of Breath and Swelling of Extremities. Gastrointestinal Present- Abdominal Pain and Nausea. Not Present- Bloating, Bloody Stool, Change in Bowel Habits, Chronic diarrhea, Constipation, Difficulty Swallowing, Excessive gas, Gets full quickly at meals, Hemorrhoids, Indigestion, Rectal Pain and Vomiting. Female Genitourinary Not Present- Frequency, Nocturia, Painful Urination, Pelvic Pain and Urgency. Musculoskeletal Not Present- Back Pain, Joint Pain, Joint Stiffness, Muscle Pain, Muscle Weakness and Swelling of Extremities. Neurological Not Present- Decreased Memory, Fainting, Headaches, Numbness, Seizures, Tingling, Tremor, Trouble walking and Weakness. Psychiatric Not Present- Anxiety, Bipolar, Change in Sleep Pattern, Depression, Fearful and Frequent crying. Endocrine Not Present- Cold Intolerance, Excessive Hunger, Hair Changes, Heat Intolerance, Hot flashes and New Diabetes. Hematology Not Present- Blood Thinners, Easy Bruising, Excessive bleeding, Gland problems, HIV and Persistent Infections.  Vitals  Weight: 231 lb Height: 68in Body Surface Area: 2.17 m Body Mass Index: 35.12 kg/m  Pulse: 89 (Regular)        Physical Exam  General Mental Status-Alert. General Appearance-Consistent with stated age. Hydration-Well  hydrated. Voice-Normal.  Head and Neck Head-normocephalic, atraumatic with no lesions or palpable masses. Trachea-midline. Thyroid  Gland Characteristics - normal size and consistency.  Eye Eyeball - Bilateral-Extraocular movements intact. Sclera/Conjunctiva - Bilateral-No scleral icterus.  Chest and Lung Exam Chest and lung exam reveals -quiet, even and easy respiratory effort with no use of accessory muscles and on auscultation, normal breath sounds, no adventitious sounds and normal vocal resonance. Inspection Chest Wall - Normal. Back - normal.  Cardiovascular Cardiovascular examination reveals -normal heart sounds, regular rate and rhythm with no murmurs and normal pedal pulses bilaterally.  Abdomen Inspection Inspection of the abdomen reveals - No Hernias. Skin - Scar - no surgical scars. Palpation/Percussion Palpation and Percussion of the abdomen reveal - Soft, Non Tender, No Rebound tenderness, No Rigidity (guarding) and No hepatosplenomegaly. Auscultation Auscultation of the abdomen reveals - Bowel sounds normal.  Neurologic Neurologic evaluation reveals -alert and oriented x 3 with no impairment of recent or remote memory. Mental Status-Normal.  Musculoskeletal Normal Exam - Left-Upper Extremity Strength Normal and Lower Extremity Strength Normal. Normal Exam - Right-Upper Extremity Strength Normal and Lower Extremity Strength Normal.  Lymphatic Head & Neck  General Head & Neck Lymphatics: Bilateral - Description - Normal. Axillary  General Axillary Region: Bilateral - Description - Normal. Tenderness - Non Tender. Femoral & Inguinal  Generalized Femoral & Inguinal Lymphatics: Bilateral - Description - Normal. Tenderness - Non Tender.    Assessment & Plan GALLSTONES (K80.20) Impression: the patient appears to have symptomatic gallstones. Because of the risk of further painful episodes and possible pancreatitis think she would benefit  from having her gallbladder removed. She would also like to have this done. I have discussed with her in detail the risks and benefits of the operation to remove the gallbladder as well as some of the technical aspects and she understands and wishes to proceed. I will plan for a laparoscopic cholecystectomy with intraoperative cholangiogram. This patient encounter took 30 minutes today to perform the following: take history, perform exam, review outside records, interpret imaging, counsel the patient on their diagnosis and document encounter, findings & plan in the EHR

## 2020-04-23 ENCOUNTER — Encounter (HOSPITAL_COMMUNITY): Payer: Self-pay | Admitting: General Surgery

## 2020-04-23 LAB — SURGICAL PATHOLOGY

## 2021-01-09 ENCOUNTER — Other Ambulatory Visit (HOSPITAL_COMMUNITY): Payer: Self-pay | Admitting: Internal Medicine

## 2021-01-09 DIAGNOSIS — I341 Nonrheumatic mitral (valve) prolapse: Secondary | ICD-10-CM

## 2021-02-05 ENCOUNTER — Ambulatory Visit (HOSPITAL_COMMUNITY)
Admission: RE | Admit: 2021-02-05 | Discharge: 2021-02-05 | Disposition: A | Discharge status: Home / Self Care | Payer: BC Managed Care – PPO | Source: Ambulatory Visit | Attending: Internal Medicine | Admitting: Internal Medicine

## 2021-02-05 ENCOUNTER — Other Ambulatory Visit: Payer: Self-pay

## 2021-02-05 DIAGNOSIS — I341 Nonrheumatic mitral (valve) prolapse: Secondary | ICD-10-CM | POA: Diagnosis not present

## 2021-02-05 DIAGNOSIS — R011 Cardiac murmur, unspecified: Secondary | ICD-10-CM | POA: Insufficient documentation

## 2021-02-05 LAB — ECHOCARDIOGRAM COMPLETE
Area-P 1/2: 2.8 cm2
S' Lateral: 3 cm

## 2021-02-05 NOTE — Progress Notes (Signed)
  Echocardiogram 2D Echocardiogram has been performed.  Tye Savoy 02/05/2021, 10:48 AM

## 2021-02-13 ENCOUNTER — Other Ambulatory Visit: Payer: Self-pay | Admitting: Oral Surgery

## 2022-03-07 IMAGING — CT CT ABDOMEN W/ CM
2 of 5 series · 11 of 46 positions shown, 12 images · IV contrast (iopamidol)
Comparison: None.

CLINICAL DATA: Right upper quadrant pain for 6 weeks.

EXAM:
CT ABDOMEN WITH CONTRAST
TECHNIQUE: Multidetector CT imaging of the abdomen was performed using the
standard protocol following bolus administration of intravenous
contrast.
CONTRAST:  100mL QUAT85-CSS IOPAMIDOL (QUAT85-CSS) INJECTION 61%

[Series 2: abd with 5.00 br40 s3 axial · axial · 0.60mm/px · z∈[+1325,+1565]mm · 8 of 59 slices shown, 9 images]
[im 7/59  soft-tissue]
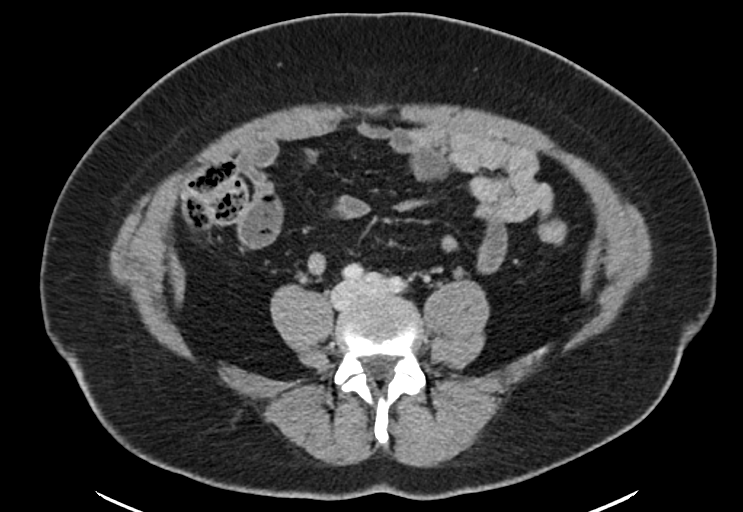
[im 7/59  bone]
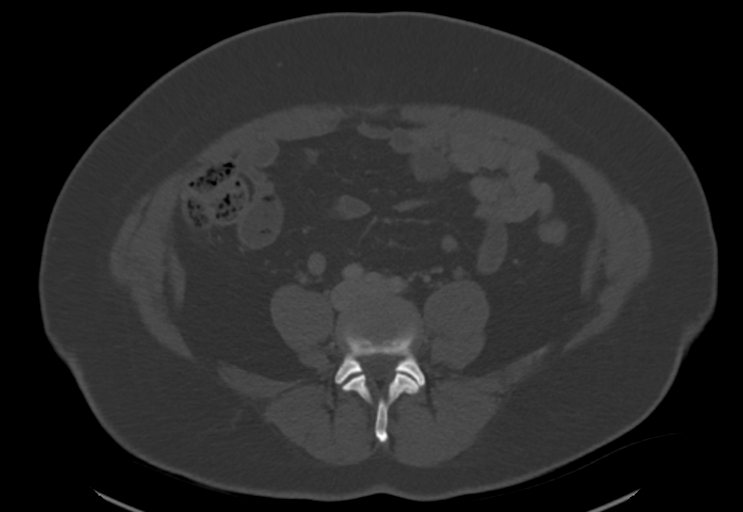
[im 14/59  soft-tissue]
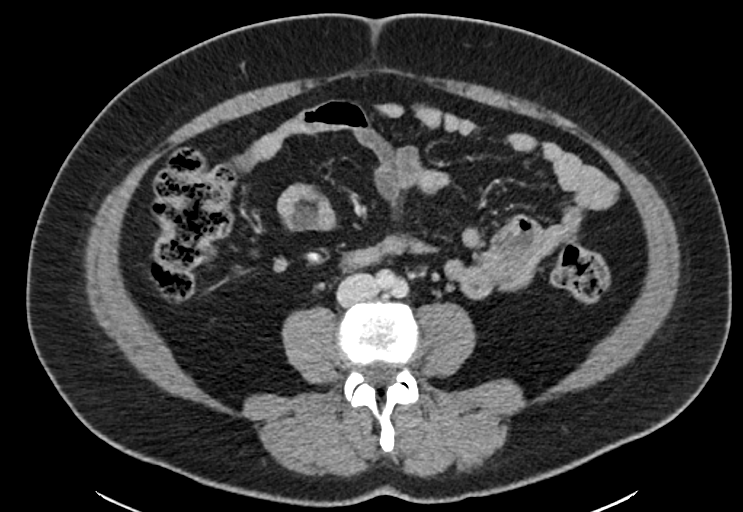
[im 21/59  soft-tissue]
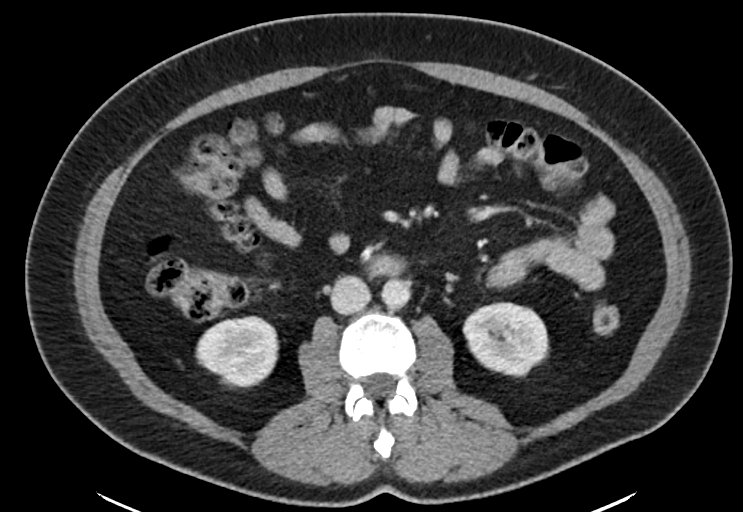
[im 28/59  soft-tissue]
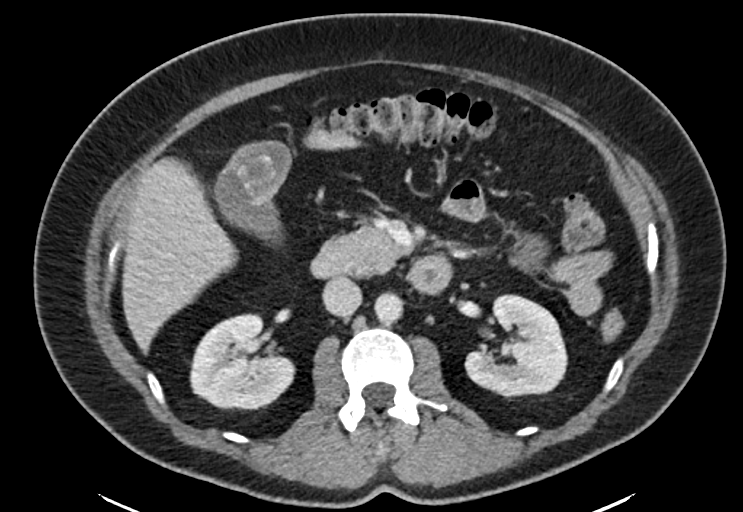
[im 35/59  soft-tissue]
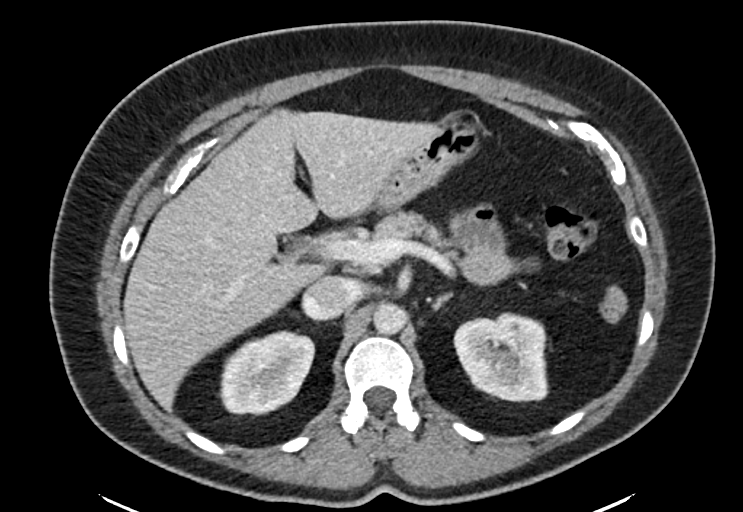
[im 41/59  soft-tissue]
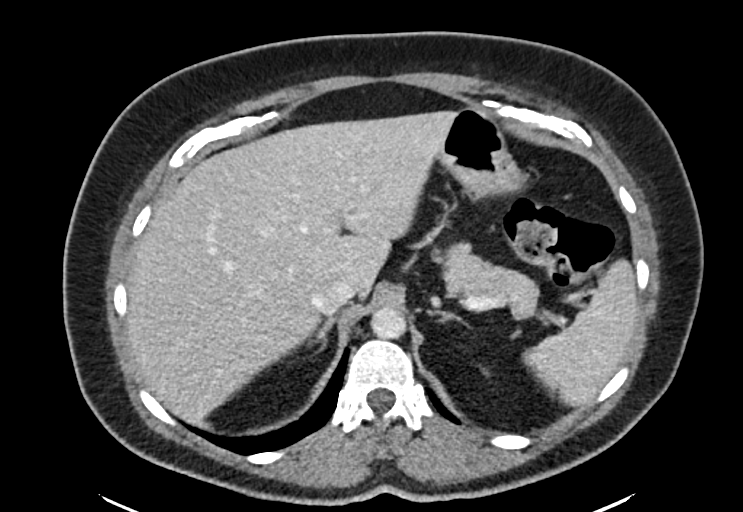
[im 48/59  soft-tissue]
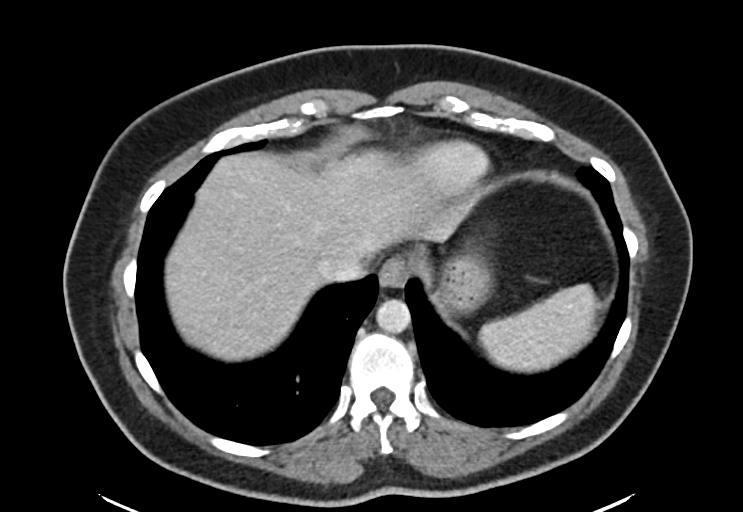
[im 55/59  soft-tissue]
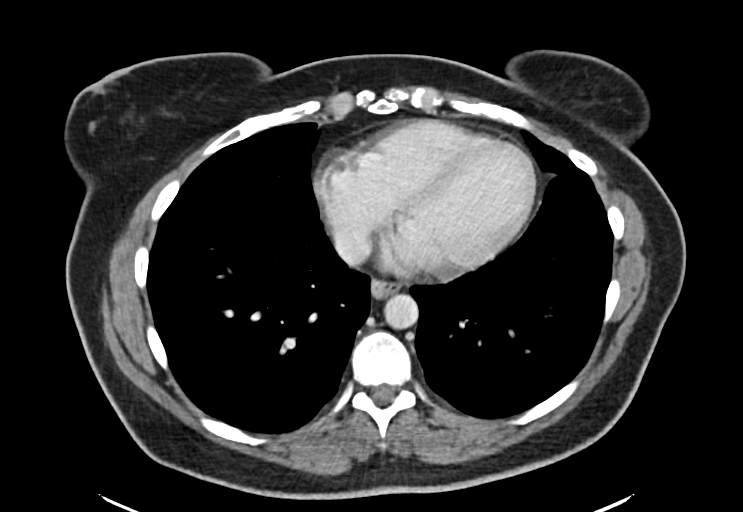

[Series 8: abd with 2.00 br40 s3 cor · coronal · 0.58mm/px · 3 of 152 slices shown]
[im 51/152  soft-tissue]
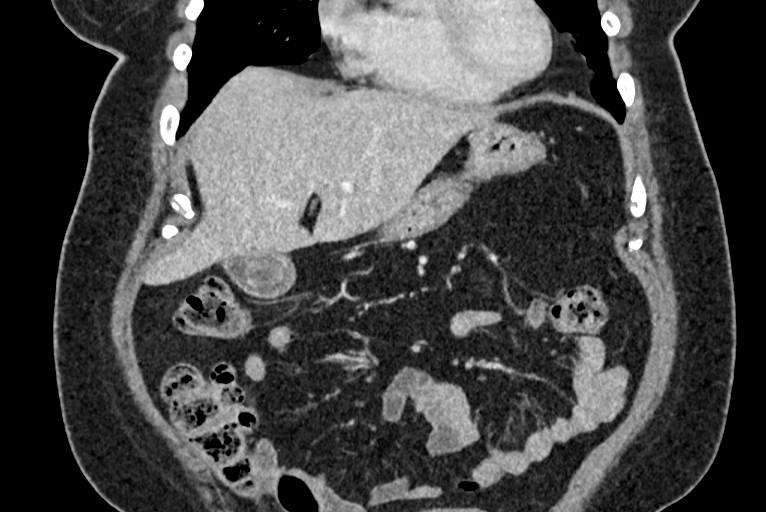
[im 68/152  soft-tissue]
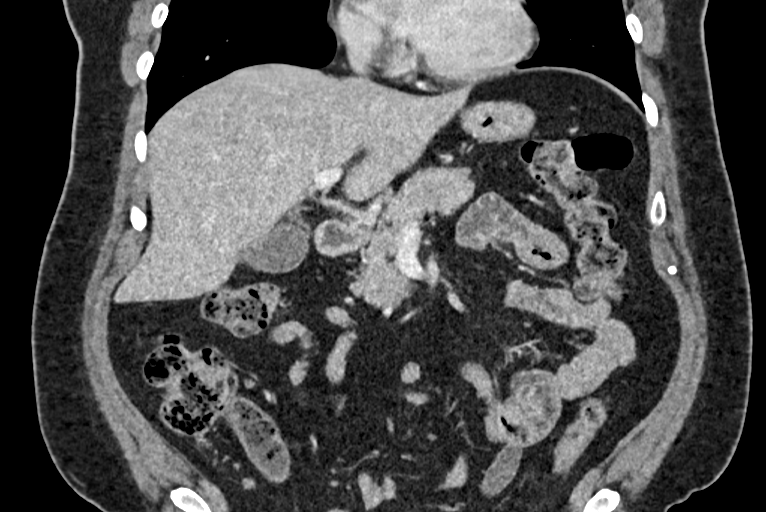
[im 84/152  soft-tissue]
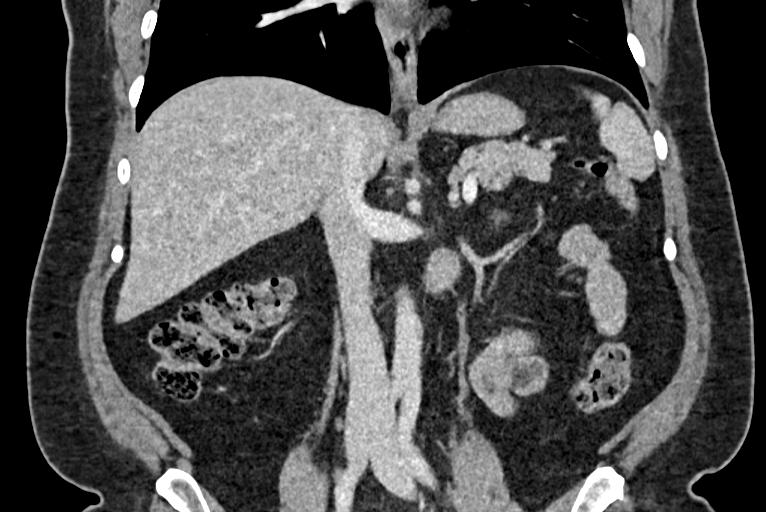

[11 of 46 positions shown; findings below may reference images not displayed]

FINDINGS: Lower chest: Unremarkable.

Hepatobiliary: No suspicious focal abnormality within the liver
parenchyma. 2.1 cm calcified stone identified in the gallbladder
fundus. Gallbladder wall thickness in the fundal region measures up
to 3-4 cm. No intrahepatic or extrahepatic biliary dilation.

Pancreas: No focal mass lesion. No dilatation of the main duct. No
intraparenchymal cyst. No peripancreatic edema.

Spleen: No splenomegaly. No focal mass lesion.

Adrenals/Urinary Tract: No adrenal nodule or mass. Right kidney
unremarkable. Tiny hypodensity in the lower pole left kidney is too
small to characterize but likely benign. No hydronephrosis.

Stomach/Bowel: Stomach is unremarkable. No gastric wall thickening.
No evidence of outlet obstruction. Duodenum is normally positioned
as is the ligament of Treitz. No small bowel or colonic dilatation
within the visualized abdomen.

Vascular/Lymphatic: No abdominal aortic aneurysm. No abdominal
aortic atherosclerotic calcification. There is no gastrohepatic or
hepatoduodenal ligament lymphadenopathy. No retroperitoneal or
mesenteric lymphadenopathy.

Other: No intraperitoneal free fluid.

Musculoskeletal: No worrisome lytic or sclerotic osseous
abnormality.
IMPRESSION: 1. 2.1 cm calcified stone identified in the gallbladder fundus.
Gallbladder wall thickness in the fundal region measures up to 3-4
cm. Imaging features are nonspecific and may be related to
underlying chronic cholecystitis. Abdominal ultrasound could be used
to further evaluate as clinically warranted.

## 2022-03-18 IMAGING — RF Imaging study
1 series · 5 of 5 positions shown · non-contrast
Comparison: none

[Series 1: unknown protocol · 0.20mm/px · 2 acquisitions, 5 frames shown]
[im 1/2]
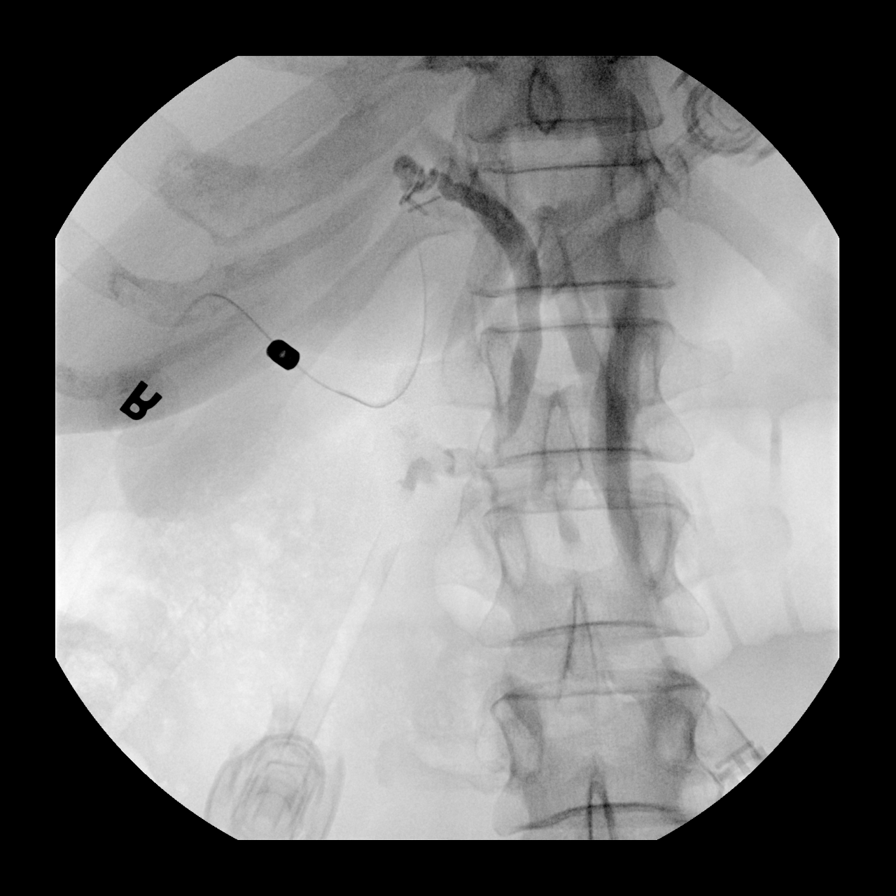
[im 1/2]
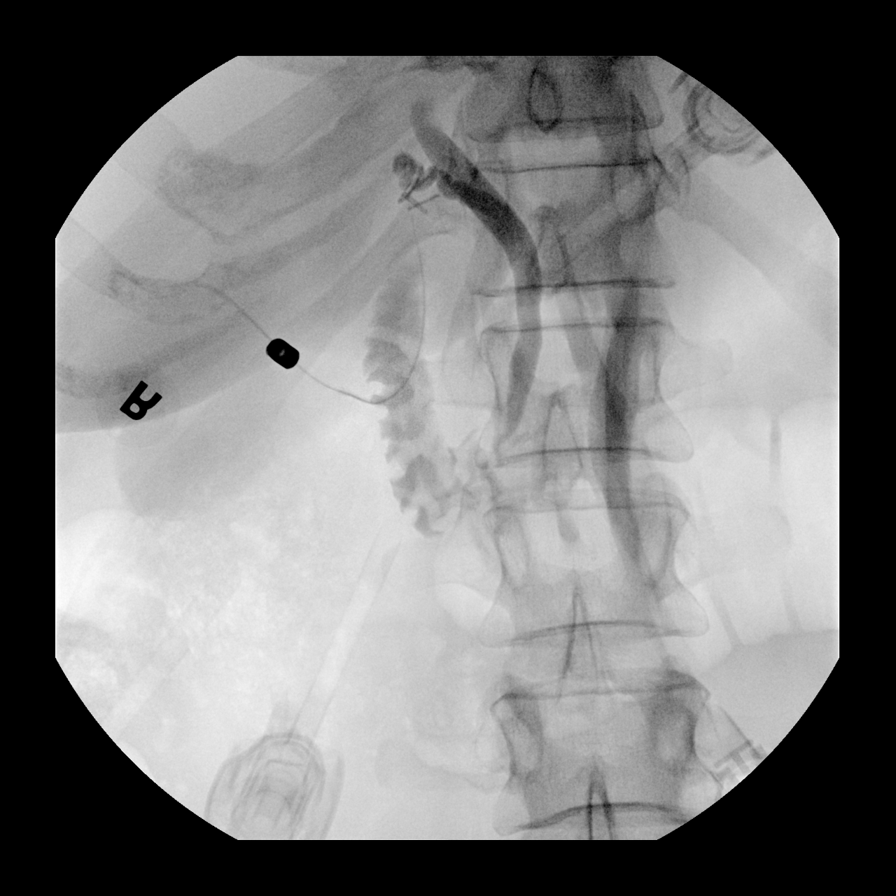
[im 1/2]
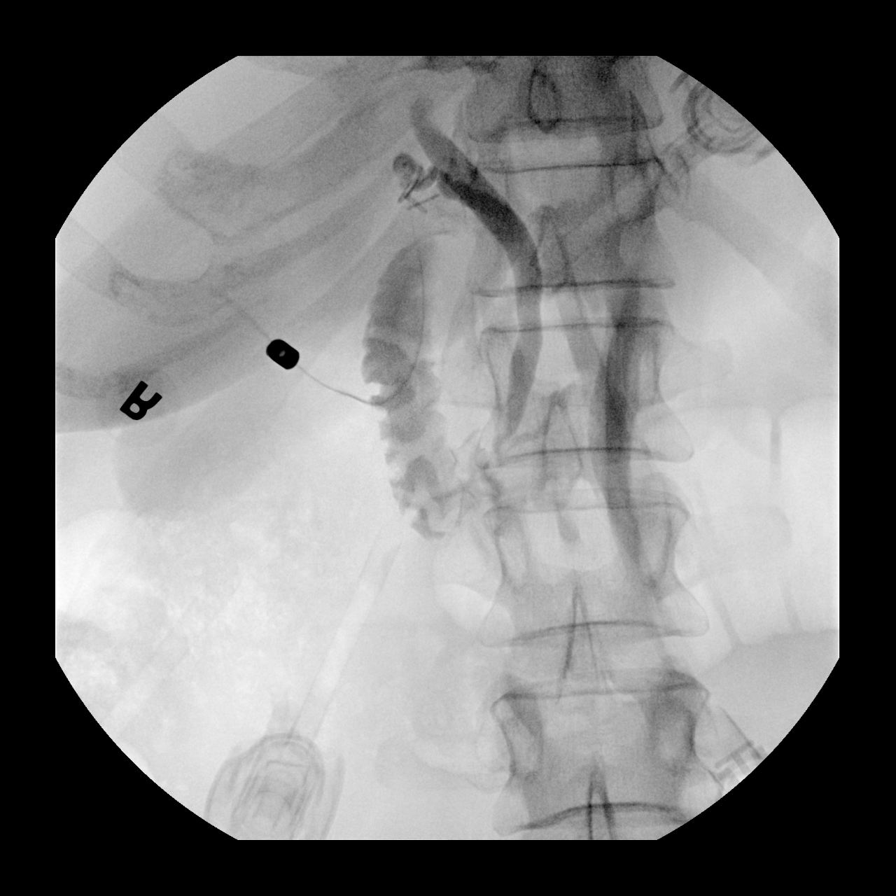
[im 1/2]
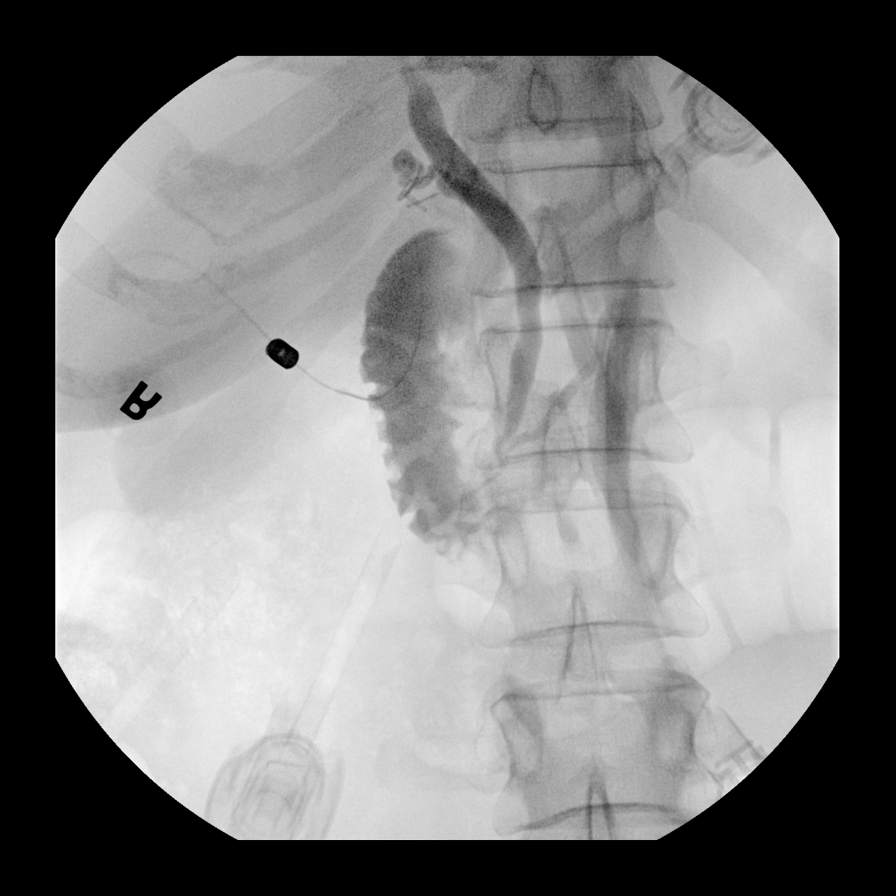
[im 2/2]
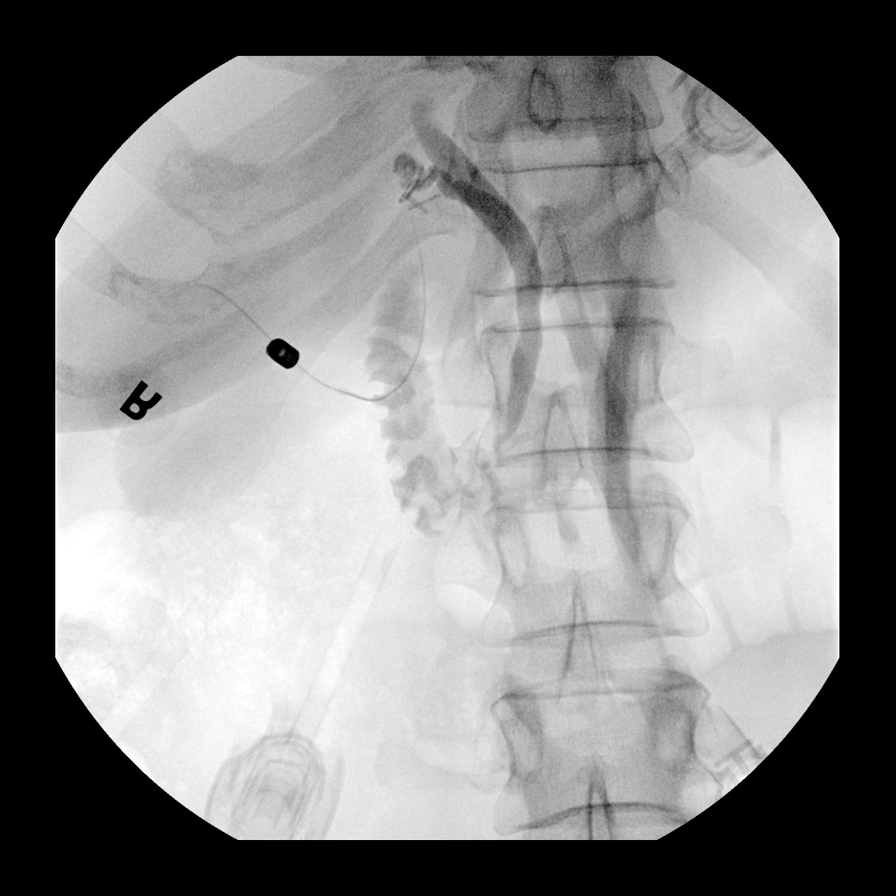

[5 of 5 positions shown; findings below may reference images not displayed]

NAME: JIM, MARK

NAME: JIM, MARK
EXAM: ELBOW ROUTINE
ORD.PHYS: RIEMER, OMY

REASON: X-RAY RT FOREARM..[id number]

No acute bony or joint abnormality is noted.

## 2022-10-13 ENCOUNTER — Ambulatory Visit: Payer: BC Managed Care – PPO | Admitting: Sports Medicine

## 2022-10-13 ENCOUNTER — Ambulatory Visit
Admission: RE | Admit: 2022-10-13 | Discharge: 2022-10-13 | Disposition: A | Discharge status: Home / Self Care | Payer: BC Managed Care – PPO | Source: Ambulatory Visit | Attending: Sports Medicine | Admitting: Sports Medicine

## 2022-10-13 VITALS — BP 136/84 | Ht 68.0 in | Wt 212.0 lb

## 2022-10-13 DIAGNOSIS — M79672 Pain in left foot: Secondary | ICD-10-CM

## 2022-10-13 MED ORDER — MELOXICAM 15 MG PO TABS: ORAL_TABLET | ORAL | 0 refills | Status: AC

## 2022-10-13 NOTE — Progress Notes (Unsigned)
   Subjective:    Patient ID: Christina Rojas, female    DOB: 16-Jul-1966, 57 y.o.   MRN: [id number]  HPI chief complaint: Left heel pain  Christina Rojas is a very pleasant 57 year old female that presents today complaining of approximately 3 weeks of left heel pain.  She is an avid walker/jogger.  She has a history of Planter fasciitis in the right heel but states that her left heel pain is different in nature than what she experienced previously.  She denies any pain with first step in the morning or after prolonged sitting.  Instead, she describes significant pain at the end of the day with walking or prolonged standing.  She took a week off from her recreational walking program and her symptoms did improve but have not resolved.  She localizes the pain to the plantar lateral aspect of the calcaneus.  Denies pain elsewhere in the heel.  Past medical history reviewed Medications reviewed Allergies reviewed    Review of Systems As above    Objective:   Physical Exam  Well-developed, well-nourished.  No acute distress  Left heel: There is no tenderness to palpation along the medial aspect of the calcaneus.  There is tenderness to palpation in the center of the calcaneus on the plantar aspect of the heel as well as along the lateral aspect of the bone.  Equivocal squeeze test.  No swelling.  Good pulses.  Limited bedside ultrasound of the left plantar fascia shows no significant thickening or edema      Assessment & Plan:   Left heel pain-question calcaneal stress fracture  Her history and physical exam findings suggest possible calcaneal stress fracture.  Her plantar fascia does not appear terribly thickened on today's bedside ultrasound.  I have provided [facility] with a set of crutches to use to assist with ambulation at the end of the day.  She will discontinue her walk/run program for now and instead will substitute an exercise bike.  I will order an x-ray of her calcaneus and prescribe  meloxicam 15 mg daily.  She will take this for 7 days and then as needed.  Follow-up with me again in 3 weeks for reevaluation.  If symptoms do not improve then I may need to reconsider the diagnosis of Planter fasciitis and tailor my treatment more toward that.

## 2022-11-03 ENCOUNTER — Ambulatory Visit: Payer: BC Managed Care – PPO | Admitting: Sports Medicine

## 2022-11-03 VITALS — BP 110/82 | Ht 68.0 in | Wt 210.0 lb

## 2022-11-03 DIAGNOSIS — S9032XD Contusion of left foot, subsequent encounter: Secondary | ICD-10-CM | POA: Diagnosis not present

## 2022-11-03 DIAGNOSIS — S9030XA Contusion of unspecified foot, initial encounter: Secondary | ICD-10-CM | POA: Insufficient documentation

## 2022-11-03 NOTE — Progress Notes (Signed)
   Established Patient Office Visit  Subjective   Patient ID: Christina Rojas, female    DOB: May 18, 1966  Age: 57 y.o. MRN: [id number]  Follow up left heel pain.  Christina Rojas is here today for follow-up of left heel pain.  At her last visit there was concern for plantar fasciitis versus calcaneal stress fracture versus calcaneal contusion.  She recently had started a new exercise routine that included twice a week 30-minute interval of walking and jogging.  She was also continuing her daily 1 mile walk with her dog.  She was given crutches at her last visit and told to discontinue her exercise routine at this time.  She had been doing stationary biking for exercise with number exacerbation of her pain.  She is here today with much improvement of her heel pain.  She gets the occasional pain in her heel but is nothing like where she was at before, over 50% reduction in her pain.   ROS as listed above in HPI    Objective:     BP 110/82   Ht '5\' 8"'$  (1.727 m)   Wt 210 lb (95.3 kg)   BMI 31.93 kg/m   Physical Exam Vitals reviewed.  Constitutional:      General: She is not in acute distress.    Appearance: Normal appearance. She is obese. She is not ill-appearing or toxic-appearing.  Pulmonary:     Effort: Pulmonary effort is normal.  Neurological:     Mental Status: She is alert.   Left foot: No obvious deformity or asymmetry.  No ecchymosis or edema.  Some slight tenderness to palpation plantar surface of the heel.  No tenderness to palpation at the origin of the plantar fascia.  No pain with extreme dorsiflexion.  Full range of motion plantarflexion, dorsiflexion, inversion and eversion.  Negative calcaneal squeeze test.       Assessment & Plan:   Problem List Items Addressed This Visit       Other   Contusion of foot or heel - Primary    Improvement of her pain with cessation of headache and crutches for ambulation.  X-rays negative for any fracture.  There is a small heel spur  however noncontributory to her current pathology.  At this point we would recommend patient slowly return to activity as tolerated.  Recommend she begin at 50% of her total walking and running weekly average.  After she has completed that pain-free recommend she increase by approximately 10% each week.  She verbalized understanding.  She was also given sport insoles with a scaphoid pad as her previous insoles had a very hard metal base.  Follow-up as needed.       Return if symptoms worsen or fail to improve.    Elmore Guise, DO  Patient seen and evaluated with the sports medicine fellow.  I agree with the above plan of care.  Treatment as above and follow-up for ongoing or recalcitrant issues.

## 2022-11-03 NOTE — Assessment & Plan Note (Signed)
Improvement of her pain with cessation of headache and crutches for ambulation.  X-rays negative for any fracture.  There is a small heel spur however noncontributory to her current pathology.  At this point we would recommend patient slowly return to activity as tolerated.  Recommend she begin at 50% of her total walking and running weekly average.  After she has completed that pain-free recommend she increase by approximately 10% each week.  She verbalized understanding.  She was also given sport insoles with a scaphoid pad as her previous insoles had a very hard metal base.  Follow-up as needed.

## 2022-11-12 ENCOUNTER — Ambulatory Visit: Payer: BC Managed Care – PPO | Admitting: Sports Medicine

## 2022-11-12 ENCOUNTER — Encounter: Payer: Self-pay | Admitting: Sports Medicine

## 2022-11-12 VITALS — BP 110/72 | Ht 68.0 in | Wt 210.0 lb

## 2022-11-12 DIAGNOSIS — M79672 Pain in left foot: Secondary | ICD-10-CM

## 2022-11-12 NOTE — Progress Notes (Signed)
   Subjective:    Patient ID: Christina Rojas, female    DOB: [birth year], 57 y.o.   MRN: [id number]  HPI  Christina Rojas presents today with worsening left heel pain.  Although she was improving at her last office visit, her pain is now beginning to return.  It is different than the pain that she has had with Planter fasciitis in the past.  Instead of having pain with first step, she is getting worsening pain at the end of the day.  Although she is not yet to the point of limping, she is noticing more and more discomfort.  She localizes her pain to the middle of the left heel.  Previous x-rays showed no obvious bony abnormality and a small heel spur at the plantar fascial origin.  Previous ultrasound did not show significant thickening of the plantar fascia.    Review of Systems As above    Objective:   Physical Exam  Well-developed, well-nourished.  No acute distress  Left calcaneus: There is tenderness to palpation directly in the midportion of the calcaneus.  Equivocal calcaneal squeeze.  No soft tissue swelling.  Good ankle range of motion.  No effusion.  Good pulses.  X-ray and ultrasound findings are as above      Assessment & Plan:   Worsening left heel pain worrisome for occult calcaneal stress fracture  Given her worsening pain along with a relatively unremarkable x-ray and ultrasound, we will proceed with an MRI of the left calcaneus specifically to rule out an occult stress fracture.  I will follow-up with [facility] via MyChart with those results when available.  In the meantime, she may need to resume use of her crutches to help limit her weightbearing.  This note was dictated using Dragon naturally speaking software and may contain errors in syntax, spelling, or content which have not been identified prior to signing this note.

## 2022-11-20 ENCOUNTER — Ambulatory Visit
Admission: RE | Admit: 2022-11-20 | Discharge: 2022-11-20 | Disposition: A | Discharge status: Home / Self Care | Payer: BC Managed Care – PPO | Source: Ambulatory Visit | Attending: Sports Medicine | Admitting: Sports Medicine

## 2022-11-20 DIAGNOSIS — M79672 Pain in left foot: Secondary | ICD-10-CM

## 2022-11-25 ENCOUNTER — Encounter: Payer: Self-pay | Admitting: Sports Medicine

## 2023-01-08 ENCOUNTER — Encounter: Payer: Self-pay | Admitting: Sports Medicine

## 2023-03-17 ENCOUNTER — Other Ambulatory Visit (HOSPITAL_BASED_OUTPATIENT_CLINIC_OR_DEPARTMENT_OTHER): Payer: Self-pay

## 2023-03-17 MED ORDER — ZEPBOUND 2.5 MG/0.5ML ~~LOC~~ SOAJ
2.5000 mg | SUBCUTANEOUS | 11 refills | Status: AC
  Filled 2023-03-17: qty 2, 28d supply, fill #0

## 2023-04-05 ENCOUNTER — Other Ambulatory Visit (HOSPITAL_BASED_OUTPATIENT_CLINIC_OR_DEPARTMENT_OTHER): Payer: Self-pay

## 2023-11-01 ENCOUNTER — Other Ambulatory Visit: Payer: Self-pay | Admitting: Obstetrics and Gynecology

## 2023-11-01 DIAGNOSIS — R928 Other abnormal and inconclusive findings on diagnostic imaging of breast: Secondary | ICD-10-CM

## 2023-11-09 ENCOUNTER — Other Ambulatory Visit: Payer: Self-pay | Admitting: Obstetrics and Gynecology

## 2023-11-09 ENCOUNTER — Ambulatory Visit
Admission: RE | Admit: 2023-11-09 | Discharge: 2023-11-09 | Disposition: A | Discharge status: Home / Self Care | Source: Ambulatory Visit | Attending: Obstetrics and Gynecology | Admitting: Obstetrics and Gynecology

## 2023-11-09 DIAGNOSIS — R928 Other abnormal and inconclusive findings on diagnostic imaging of breast: Secondary | ICD-10-CM

## 2023-11-09 DIAGNOSIS — R921 Mammographic calcification found on diagnostic imaging of breast: Secondary | ICD-10-CM

## 2023-11-10 ENCOUNTER — Encounter

## 2023-11-15 ENCOUNTER — Ambulatory Visit
Admission: RE | Admit: 2023-11-15 | Discharge: 2023-11-15 | Disposition: A | Discharge status: Home / Self Care | Source: Ambulatory Visit | Attending: Obstetrics and Gynecology | Admitting: Obstetrics and Gynecology

## 2023-11-15 DIAGNOSIS — R921 Mammographic calcification found on diagnostic imaging of breast: Secondary | ICD-10-CM

## 2023-11-16 LAB — SURGICAL PATHOLOGY
